# Patient Record
Sex: Female | Born: 1990 | Race: White | Hispanic: No | Marital: Single | Smoking: Never smoker
Health system: Southern US, Community
[De-identification: ages and names within clinical notes are randomized; demographics above are authoritative.]

## PROBLEM LIST (undated history)

## (undated) HISTORY — PX: WISDOM TOOTH EXTRACTION: SHX21

---

## 2017-08-13 ENCOUNTER — Other Ambulatory Visit: Payer: Self-pay | Admitting: Family Medicine

## 2017-08-13 ENCOUNTER — Ambulatory Visit
Admission: RE | Admit: 2017-08-13 | Discharge: 2017-08-13 | Disposition: A | Discharge status: Home / Self Care | Payer: 59 | Source: Ambulatory Visit | Attending: Family Medicine | Admitting: Family Medicine

## 2017-08-13 DIAGNOSIS — R0789 Other chest pain: Secondary | ICD-10-CM

## 2018-01-19 ENCOUNTER — Other Ambulatory Visit: Payer: Self-pay | Admitting: Family Medicine

## 2018-01-19 ENCOUNTER — Ambulatory Visit
Admission: RE | Admit: 2018-01-19 | Discharge: 2018-01-19 | Disposition: A | Discharge status: Home / Self Care | Payer: 59 | Source: Ambulatory Visit | Attending: Family Medicine | Admitting: Family Medicine

## 2018-01-19 DIAGNOSIS — W19XXXA Unspecified fall, initial encounter: Secondary | ICD-10-CM

## 2019-01-14 ENCOUNTER — Ambulatory Visit (HOSPITAL_COMMUNITY)
Admission: EM | Admit: 2019-01-14 | Discharge: 2019-01-14 | Disposition: A | Discharge status: Home / Self Care | Payer: 59

## 2019-01-14 ENCOUNTER — Other Ambulatory Visit: Payer: Self-pay

## 2019-01-14 ENCOUNTER — Encounter (HOSPITAL_COMMUNITY): Payer: Self-pay | Admitting: Emergency Medicine

## 2019-01-14 DIAGNOSIS — K122 Cellulitis and abscess of mouth: Secondary | ICD-10-CM

## 2019-01-14 DIAGNOSIS — J029 Acute pharyngitis, unspecified: Secondary | ICD-10-CM | POA: Diagnosis not present

## 2019-01-14 LAB — POCT INFECTIOUS MONO SCREEN: Mono Screen: NEGATIVE

## 2019-01-14 MED ORDER — AMOXICILLIN-POT CLAVULANATE 875-125 MG PO TABS: 1.0000 | ORAL_TABLET | Freq: Two times a day (BID) | ORAL | 0 refills | Status: AC

## 2019-01-14 MED ORDER — PREDNISONE 20 MG PO TABS: 20.0000 mg | ORAL_TABLET | Freq: Every day | ORAL | 0 refills | Status: AC

## 2019-01-14 NOTE — Discharge Instructions (Addendum)
Your mono test is negative, but sometimes it takes longer than one week for the test to turn positive, so if your symptoms persist with fatigue and swollen glands, make sure you follow up with your family Dr. If you dont have one you may establish with me.   I am placing you on 5 days of prednisone to help with the inflammation and swelling of your uvula, and an antibiotic to help prevent peritonsillar abscess since there is a little swelling above your L tonsil which may start this way with the swelling above the tonsil area.   You may use saline rinses like Netie pot to help clear the post nasal drainage twice a day for 5-7 days.

## 2019-01-14 NOTE — ED Provider Notes (Signed)
[facility]    CSN: [id number] Arrival date & time: 01/14/19  1024     History   Chief Complaint Chief Complaint  Patient presents with  . Sore Throat    HPI Olivia Cox is a 28 y.o. female.   Onset of ST x 5 days and noticed her uvula looked swollen since yesterday and noticed it when she was gagging. Had neg strep 2 days ago. Has been having a lot of post nasal dranaige. Denies itchy throat. And her glands got swollen and tender x 3 days. Has been tired. Missed work the last 2 days. Had HA yesterday. ST is worse on L than R today.      History reviewed. No pertinent past medical history.  There are no active problems to display for this patient.   Past Surgical History:  Procedure Laterality Date  . WISDOM TOOTH EXTRACTION      OB History   No obstetric history on file.      Home Medications    Prior to Admission medications   Medication Sig Start Date End Date Taking? Authorizing Provider  ibuprofen (ADVIL,MOTRIN) 400 MG tablet Take 400 mg by mouth every 6 (six) hours as needed.   Yes [provider]  norethindrone-ethinyl estradiol-iron (MICROGESTIN FE,GILDESS FE,LOESTRIN FE) 1.5-30 MG-MCG tablet Take 1 tablet by mouth daily.   Yes [provider]  amoxicillin-clavulanate (AUGMENTIN) 875-125 MG tablet Take 1 tablet by mouth every 12 (twelve) hours for 10 days. 01/14/19 01/24/19  Rodriguez-Southworth, Nettie Elm, PA-C  predniSONE (DELTASONE) 20 MG tablet Take 1 tablet (20 mg total) by mouth daily. 01/14/19   Rodriguez-Southworth, Nettie Elm, PA-C    Family History History reviewed. No pertinent family history.  Social History Social History   Tobacco Use  . Smoking status: Never Smoker  . Smokeless tobacco: Never Used  Substance Use Topics  . Alcohol use: Yes    Frequency: Never    Comment: Occassional  . Drug use: Never     Allergies   Patient has no known allergies.   Review of Systems Review of Systems    Constitutional: Positive for chills and fatigue. Negative for diaphoresis and fever.  HENT: Positive for postnasal drip, rhinorrhea, sore throat and trouble swallowing. Negative for dental problem, ear discharge, ear pain and voice change.   Eyes: Negative for discharge.  Respiratory: Negative for cough, chest tightness and shortness of breath.   Cardiovascular: Negative for chest pain.  Gastrointestinal: Negative for abdominal pain.  Musculoskeletal: Negative for myalgias.  Skin: Negative for rash.  Neurological: Positive for headaches. Negative for dizziness.  Hematological: Negative for adenopathy.   Physical Exam Triage Vital Signs ED Triage Vitals  Enc Vitals Group     BP 01/14/19 1118 122/71     Pulse Rate 01/14/19 1118 75     Resp 01/14/19 1118 16     Temp 01/14/19 1118 98.6 F (37 C)     Temp Source 01/14/19 1118 Oral     SpO2 01/14/19 1118 100 %     Weight --      Height --      Head Circumference --      Peak Flow --      Pain Score 01/14/19 1116 4     Pain Loc --      Pain Edu? --      Excl. in GC? --    No data found.  Updated Vital Signs BP 122/71 (BP Location: Left Arm)   Pulse 75  Temp 98.6 F (37 C) (Oral)   Resp 16   LMP 01/14/2019 (Exact Date)   SpO2 100%   Visual Acuity Right Eye Distance:   Left Eye Distance:   Bilateral Distance:    Right Eye Near:   Left Eye Near:    Bilateral Near:     Physical Exam Vitals signs and nursing note reviewed.  Constitutional:      General: She is not in acute distress.    Appearance: She is not toxic-appearing.  HENT:     Right Ear: Tympanic membrane normal. No drainage, swelling or tenderness. Tympanic membrane is not erythematous.     Left Ear: Tympanic membrane normal. No swelling or tenderness. Tympanic membrane is not erythematous.     Nose: Rhinorrhea present. No congestion.     Mouth/Throat:     Mouth: Mucous membranes are dry. No oral lesions.     Pharynx: Uvula midline. Posterior  oropharyngeal erythema and uvula swelling present. No oropharyngeal exudate.     Tonsils: No tonsillar exudate. Swelling: 2+ on the right. 2+ on the left.     Comments: Has mild swelling over the L tonsil, but uvula is not deviated Eyes:     Conjunctiva/sclera: Conjunctivae normal.  Neck:     Musculoskeletal: Neck supple.     Thyroid: No thyromegaly.     Comments: Has enlarged tender bilateral upper chain nodes, but none on posterior or supraclavicular region Cardiovascular:     Rate and Rhythm: Normal rate and regular rhythm.     Heart sounds: No murmur.  Pulmonary:     Effort: Pulmonary effort is normal.     Breath sounds: Normal breath sounds.  Abdominal:     General: Bowel sounds are normal.     Palpations: Abdomen is soft. There is no mass.     Tenderness: There is no abdominal tenderness. There is no guarding.  Lymphadenopathy:     Cervical: Cervical adenopathy present.  Skin:    General: Skin is warm and dry.     Findings: No rash.  Neurological:     General: No focal deficit present.     Mental Status: She is alert and oriented to person, place, and time.  Psychiatric:        Mood and Affect: Mood normal.        Behavior: Behavior normal.    UC Treatments / Results  Labs (all labs ordered are listed, but only abnormal results are displayed) Labs Reviewed  POCT INFECTIOUS MONO SCREEN  POCT INFECTIOUS MONO SCREEN    EKG None  Radiology No results found.  Procedures Procedures   Medications Ordered in UC Medications - No data to display  Initial Impression / Assessment and Plan / UC Course  I have reviewed the triage vital signs and the nursing notes. Pertinent labs  results that were available during my care of the patient were reviewed by me and considered in my medical decision making (see chart for details). She seems to have inflammation  And infection of her uvula and I am concerned of early L peritonsillar abscess. I placed her on Augmentin and  Prednisone as noted. If she gets worse needs to go to ER. See instructions.     Final Clinical Impressions(s) / UC Diagnoses   Final diagnoses:  Uvulitis     Discharge Instructions     Your mono test is negative, but sometimes it takes longer than one week for the test to turn positive, so if your symptoms  persist with fatigue and swollen glands, make sure you follow up with your family Dr. If you dont have one you may establish with me.   I am placing you on 5 days of prednisone to help with the inflammation and swelling of your uvula, and an antibiotic to help prevent peritonsillar abscess since there is a little swelling above your L tonsil which may start this way with the swelling above the tonsil area.   You may use saline rinses like Netie pot to help clear the post nasal drainage twice a day for 5-7 days.      ED Prescriptions    Medication Sig Dispense Auth. Provider   predniSONE (DELTASONE) 20 MG tablet Take 1 tablet (20 mg total) by mouth daily. 5 tablet Rodriguez-Southworth, Nettie ElmSylvia, PA-C   amoxicillin-clavulanate (AUGMENTIN) 875-125 MG tablet Take 1 tablet by mouth every 12 (twelve) hours for 10 days. 20 tablet Rodriguez-Southworth, Nettie ElmSylvia, PA-C     Controlled Substance Prescriptions [place] Controlled Substance Registry consulted?    Garey HamRodriguez-Southworth, Ary Rudnick, [place] 01/15/19 1938

## 2019-01-14 NOTE — ED Triage Notes (Signed)
The patient presented to the [facility] with a complaint of a sore throat x 5 days. The patient denied any fever.

## 2019-09-20 ENCOUNTER — Encounter (HOSPITAL_COMMUNITY): Payer: Self-pay

## 2019-09-20 ENCOUNTER — Ambulatory Visit (INDEPENDENT_AMBULATORY_CARE_PROVIDER_SITE_OTHER): Payer: BC Managed Care – PPO

## 2019-09-20 ENCOUNTER — Ambulatory Visit (HOSPITAL_COMMUNITY)
Admission: EM | Admit: 2019-09-20 | Discharge: 2019-09-20 | Disposition: A | Discharge status: Home / Self Care | Payer: BC Managed Care – PPO | Attending: Family Medicine | Admitting: Family Medicine

## 2019-09-20 DIAGNOSIS — M545 Low back pain, unspecified: Secondary | ICD-10-CM

## 2019-09-20 MED ORDER — MELOXICAM 15 MG PO TABS: 15.0000 mg | ORAL_TABLET | Freq: Every day | ORAL | 0 refills | Status: AC

## 2019-09-20 MED ORDER — CYCLOBENZAPRINE HCL 5 MG PO TABS: 5.0000 mg | ORAL_TABLET | Freq: Three times a day (TID) | ORAL | 0 refills | Status: AC | PRN

## 2019-09-20 NOTE — Discharge Instructions (Addendum)
Xray without acute findings here today.  Meloxicam daily, take with food. Don't take additional antifibrillatories.  Flexeril as needed as a muscle relaxer, primarily before bed, may use every 8 hours as needed, however. May cause drowsiness. Please do not take if driving or drinking alcohol.   See exercises provided for strengthening to help in prevention.   Follow up with your PCP and/or sports medicine for recheck and management if persistent.

## 2019-09-20 NOTE — ED Triage Notes (Signed)
Patient report having back pain x 4 days, she feels he back in popping all the time.

## 2019-09-20 NOTE — ED Provider Notes (Signed)
[facility]    CSN: [id number] Arrival date & time: 09/20/19  1011      History   Chief Complaint Chief Complaint  Patient presents with  . Back Pain    HPI Olivia Cox is a 28 y.o. female.   Olivia Cox presents with complaints of low back pain. She has had multiple episodes of back pain, she thinks approximately 3 times over the past 1-2 years. States she has "thrown it out." No specific injury, three days ago after showering she sat on her bed and then stood up and felt pain, has had pain ever since. Sharp and stabbing pain. Certain movements do worsen it. Laying flat worsens it. No radiation of pain. No weakness, numbness or tingling. No saddle symptoms. No loss of bladder or bowel. 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 No specific recent heavy lifting prior to onset of symptoms. Pain greater to right side but is to entire low back. Pain 7/10 in severity. Took ibuprofen yesterday morning, no medications today. States she has been seen by her PCP as well as chiropractor for this in the past. States she has an appointment next week to establish with a new PCP.     ROS per HPI, negative if not otherwise mentioned.      History reviewed. No pertinent past medical history.  There are no active problems to display for this patient.   Past Surgical History:  Procedure Laterality Date  . WISDOM TOOTH EXTRACTION      OB History   No obstetric history on file.      Home Medications    Prior to Admission medications   Medication Sig Start Date End Date Taking? Authorizing Provider  cyclobenzaprine (FLEXERIL) 5 MG tablet Take 1 tablet (5 mg total) by mouth 3 (three) times daily as needed for muscle spasms. 09/20/19   Georgetta HaberBurky, Shia Eber B, NP  ibuprofen (ADVIL,MOTRIN) 400 MG tablet Take 400 mg by mouth every 6 (six) hours as needed.    [provider]   meloxicam (MOBIC) 15 MG tablet Take 1 tablet (15 mg total) by mouth daily. 09/20/19   Georgetta HaberBurky, Suleman Gunning B, NP  norethindrone-ethinyl estradiol-iron (MICROGESTIN FE,GILDESS FE,LOESTRIN FE) 1.5-30 MG-MCG tablet Take 1 tablet by mouth daily.    [provider]  predniSONE (DELTASONE) 20 MG tablet Take 1 tablet (20 mg total) by mouth daily. 01/14/19   Rodriguez-Southworth, Nettie ElmSylvia, PA-C    Family History History reviewed. No pertinent family history.  Social History Social History   Tobacco Use  . Smoking status: Never Smoker  . Smokeless tobacco: Never Used  Substance Use Topics  . Alcohol use: Yes    Frequency: Never    Comment: Occassional  . Drug use: Never     Allergies   Patient has no known allergies.   Review of Systems Review of Systems   Physical Exam Triage Vital Signs ED Triage Vitals  Enc Vitals Group     BP 09/20/19 1055 124/62     Pulse Rate 09/20/19 1055 85     Resp 09/20/19 1055 15     Temp 09/20/19 1055 98.5 F (36.9 C)     Temp Source 09/20/19 1055 Oral     SpO2 09/20/19 1055 99 %     Weight --      Height --      Head Circumference --  Peak Flow --      Pain Score 09/20/19 1053 8     Pain Loc --      Pain Edu? --      Excl. in GC? --    No data found.  Updated Vital Signs BP 124/62 (BP Location: Right Arm)   Pulse 85   Temp 98.5 F (36.9 C) (Oral)   Resp 15   LMP 09/20/2019 (Exact Date)   SpO2 99%   Visual Acuity Right Eye Distance:   Left Eye Distance:   Bilateral Distance:    Right Eye Near:   Left Eye Near:    Bilateral Near:     Physical Exam Constitutional:      General: She is not in acute distress.    Appearance: She is well-developed.  Cardiovascular:     Rate and Rhythm: Normal rate and regular rhythm.     Heart sounds: Normal heart sounds.  Pulmonary:     Effort: Pulmonary effort is normal.     Breath sounds: Normal breath sounds.  Musculoskeletal:     Lumbar back: She exhibits pain. She exhibits normal  range of motion, no tenderness, no bony tenderness, no swelling, no edema, no deformity, no laceration, no spasm and normal pulse.       Back:     Comments: Pain to low back, unable to reproduce with palpation; pain with transition from sit to lay and lay to sit; no pain with hip flexion or straight leg raise; no step off or deformity to spinous processes, no pain to spinous processes; strength equal bilaterally; gross sensation intact to lower extremities; pain with back extension noted such as with ambulation  Skin:    General: Skin is warm and dry.  Neurological:     Mental Status: She is alert and oriented to person, place, and time.      UC Treatments / Results  Labs (all labs ordered are listed, but only abnormal results are displayed) Labs Reviewed - No data to display  EKG   Radiology Dg Lumbar Spine Complete  Result Date: 09/20/2019 CLINICAL DATA:  Back pain EXAM: LUMBAR SPINE - COMPLETE 4+ VIEW COMPARISON:  None. FINDINGS: There appear to be rudimentary ribs at the T12 level. There is no displaced fracture. The disc heights are relatively well preserved. There is mild disc height loss at the L5-S1 level IMPRESSION: Normal study. Electronically Signed   By: Katherine Mantle M.D.   On: 09/20/2019 11:41    Procedures Procedures (including critical care time)  Medications Ordered in UC Medications - No data to display  Initial Impression / Assessment and Plan / UC Course  I have reviewed the triage vital signs and the nursing notes.  Pertinent labs & imaging results that were available during my care of the patient were reviewed by me and considered in my medical decision making (see chart for details).     Intermittent pain for years. No new injury prior to onset today. Films without acute findings. Muscle strain/ spasm likely at this point. Nsaids, muscle relaxer's recommended. Follow up with pcp and/or sports medicine. Patient verbalized understanding and agreeable  to plan.  Ambulatory out of clinic without difficulty.   Final Clinical Impressions(s) / UC Diagnoses   Final diagnoses:  Acute bilateral low back pain without sciatica     Discharge Instructions     Xray without acute findings here today.  Meloxicam daily, take with food. Don't take additional antifibrillatories.  Flexeril as needed as a  muscle relaxer, primarily before bed, may use every 8 hours as needed, however. May cause drowsiness. Please do not take if driving or drinking alcohol.   See exercises provided for strengthening to help in prevention.   Follow up with your PCP and/or sports medicine for recheck and management if persistent.    ED Prescriptions    Medication Sig Dispense Auth. Provider   meloxicam (MOBIC) 15 MG tablet Take 1 tablet (15 mg total) by mouth daily. 20 tablet Augusto Gamble B, NP   cyclobenzaprine (FLEXERIL) 5 MG tablet Take 1 tablet (5 mg total) by mouth 3 (three) times daily as needed for muscle spasms. 15 tablet Zigmund Gottron, NP     PDMP not reviewed this encounter.   Zigmund Gottron, NP 09/20/19 1216

## 2019-09-28 ENCOUNTER — Other Ambulatory Visit: Payer: Self-pay

## 2019-09-28 ENCOUNTER — Ambulatory Visit: Payer: BC Managed Care – PPO | Attending: Neurosurgery | Admitting: Physical Therapy

## 2019-09-28 ENCOUNTER — Encounter: Payer: Self-pay | Admitting: Physical Therapy

## 2019-09-28 DIAGNOSIS — M545 Low back pain, unspecified: Secondary | ICD-10-CM

## 2019-09-28 DIAGNOSIS — G8929 Other chronic pain: Secondary | ICD-10-CM | POA: Insufficient documentation

## 2019-09-28 DIAGNOSIS — M6281 Muscle weakness (generalized): Secondary | ICD-10-CM | POA: Insufficient documentation

## 2019-09-28 NOTE — Therapy (Signed)
[facility] [address], [address] [place], [place], [address] Phone: [phone]   Fax:  [phone]  Physical Therapy Evaluation  Patient Details  Name: Olivia Cox MRN: [id number] Date of Birth: June 19, 1991 Referring Provider (PT): Dr. Newell CoralNudelman    Encounter Date: 09/28/2019  PT End of Session - 09/28/19 1720    Visit Number  1    Date for PT Re-Evaluation  11/23/19    Authorization Type  BCBS 60 visit limit    PT Start Time  1102    PT Stop Time  1145    PT Time Calculation (min)  43 min    Activity Tolerance  Patient tolerated treatment well       History reviewed. No pertinent past medical history.  Past Surgical History:  Procedure Laterality Date  . WISDOM TOOTH EXTRACTION      There were no vitals filed for this visit.   Subjective Assessment - 09/28/19 1104    Subjective  2 years back pain for no apparent reason.  Last Sunday, got out of bed and was in pain.  Dr. thinks it's my mild scoliosis. Sharp stabbing pain both sides near tailbone.  Sore right > left.  My back feels weak. I do the Elliptical and light weights    Pertinent History  scoliosis    How long can you sit comfortably?  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  standing to do dishes hurts    How long can you walk comfortably?  the more I move around the worse;  comfortably 30 minutes    Diagnostic tests  x-rays    Patient Stated Goals  work on posture; be able to function normally    Currently in Pain?  Yes    Pain Score  4     Pain Location  Back    Pain Orientation  Right;Left    Pain Type  Chronic pain    Pain Radiating Towards  radiating left anterior thigh and right groin    Pain Onset  More than a month ago    Pain Frequency  Constant    Aggravating Factors   sitting; twisting; lifting; I can't vacumn;  a lot of stairs if I did  them at the office    Pain Relieving Factors  lying down         [facility] - 09/28/19 0001      Assessment   Medical Diagnosis  low back pain without sciatica     Referring Provider (PT)  Dr. Newell CoralNudelman     Onset Date/Surgical Date  --   2 years but last Sunday was the pinnacle    Next MD Visit  6 weeks Nov 23    Prior Therapy  no      Precautions   Precautions  None      Restrictions   Weight Bearing Restrictions  No      Balance Screen   Has the patient fallen in the past 6 months  No    Has the patient had a decrease in activity level because of a fear of falling?   No    Is the patient reluctant to leave their home because of a fear of falling?   No      Home Public house managernvironment   Living Environment  Private residence  Living Arrangements  Alone    Type of Trainer to enter      Prior Function   Vocation  Full time employment    Vocation Requirements  mostly sitting, some lifting     Leisure  video gamer; walk dog but avoiding lately       Observation/Other Assessments   Focus on Therapeutic Outcomes (FOTO)   47% limitation       Posture/Postural Control   Posture/Postural Control  Postural limitations    Posture Comments  right scoliosis with left iliac crest higher initially however no pelvic obliquity following session       AROM   Overall AROM Comments  No clear directional preference with repeated flexion in lying or extension in lying     Lumbar Flexion  60    Lumbar Extension  15    Lumbar - Right Side Bend  45    Lumbar - Left Side Bend  40      Strength   Overall Strength Comments  difficulty activating transverse abdominus and lumbar multifidi     Right Hip Extension  4-/5    Right Hip ABduction  4-/5    Left Hip Extension  4/5    Left Hip ABduction  4/5    Lumbar Flexion  4-/5    Lumbar Extension  4-/5      Flexibility   Soft Tissue Assessment /Muscle Length  yes    Hamstrings  85 degrees bil     Quadriceps   slight reduction in right hip flexor length       Slump test   Findings  Negative      Straight Leg Raise   Findings  Negative                Objective measurements completed on examination: See above findings.              PT Education - 09/28/19 1719    Education Details  Access Code: [email or web address]  prone press ups, supine, seated and standing transverse abdom activation;  bird dogs, lumbar support with sitting    Person(s) Educated  Patient    Methods  Explanation;Demonstration;Handout    Comprehension  Returned demonstration;Verbalized understanding       PT Short Term Goals - 09/28/19 1737      PT SHORT TERM GOAL #1   Title  The patient will demonstrate understanding of basic self care, postural support when sitting and initial HEP    Time  4    Period  Weeks    Status  New    Target Date  10/26/19      PT SHORT TERM GOAL #2   Title  The patient will report a 30% reduction in bilateral low back pain as well as radiating left thigh and right groin pain with home and work ADLs    Time  4    Period  Weeks    Status  New      PT SHORT TERM GOAL #3   Title  The patient will be able to stand for 15 minutes to wash dishes, cook meals    Time  4    Period  Weeks    Status  New      PT SHORT TERM GOAL #4   Title  The patient will be able to walk her dog 30 minutes with pain level < 5/10    Time  4    Period  Weeks    Status  New        PT Long Term Goals - 09/28/19 1740      PT LONG TERM GOAL #1   Title  The patient will be independent with safe self progression of HEP for further improvements in pain/function    Time  8    Period  Weeks    Status  New    Target Date  11/23/19      PT LONG TERM GOAL #2   Title  The patient will report a 60% improvement in bilateral back pain, left thigh pain and right groin pain with sitting, standing, walking    Time  8    Period  Weeks    Status  New      PT LONG TERM GOAL #3   Title  The patient will  have improved abdominal, trunk extensor, hip extensor and abductor strength to grossly 4/5 to 4+/5 needed for climbing flights of stairs and lifting medium objects for work.    Time  8    Period  Weeks    Status  New      PT LONG TERM GOAL #4   Title  The patient will demonstrate understanding of best body mechanics for lifting and vacumning    Time  8    Period  Weeks    Status  New      PT LONG TERM GOAL #5   Title  FOTO functional outcome score improved from 47% limitation to 31%    Time  8    Period  Weeks    Status  New             Plan - 09/28/19 1721    Clinical Impression Statement  The patient is a pleasant 28 year old with a history of scoliosis and LBP for the last 2 years.  She reports that last Sunday she awoke with increased back pain bilaterally with referred pain to left anterior thigh and right groin.  She was hardly able to get out of bed.  She reports her pain is worsened with sitting too long, twisting, lifting, standing to do dishes, walking > 30 minutes and she is unable to vacumn.  She is better with lying down.  She is working from home right now sitting the majority of the day but also does some lifting for work.  Her lumbar ROM is slightly limited:  flexion 60, extension 15, right sidebending 45, left sidebending 40 degrees.  Right scoliosis with left pelvis initially higher than right although following session notably less asymmetry with patient reporting, "I can stand straighter now."  No worse or better with repeated flexion or extension in lying.  Decreased lumbo/pelvic/hip strength noted especially on right.  She has a very positive response to initiation of stabilization exercise.   She is highly motivated to finally get treatment for this problem after 2 years.  She would benefit from PT to address these deficits.    Examination-Activity Limitations  Lift;Locomotion Level;Stairs;Stand;Sit    Examination-Participation Restrictions  Community Activity;Other     Stability/Clinical Decision Making  Stable/Uncomplicated    Clinical Decision Making  Low    Rehab Potential  Good    PT Frequency  2x / week    PT Duration  8 weeks    PT Treatment/Interventions  ADLs/Self Care Home Management;Electrical Stimulation;Cryotherapy;Moist Heat;Ultrasound;Traction;Neuromuscular re-education;Therapeutic exercise;Therapeutic activities;Patient/family education;Manual techniques;Taping;Dry needling    PT Next Visit Plan  assess response to initial HEP;  progress lumbo/pelvic/hip strengthening/stabilization in progressively challenging positions; postural strengthening    PT Home Exercise Plan  Access Code: [email or web address]    Consulted and Agree with Plan of Care  Patient       Patient will benefit from skilled therapeutic intervention in order to improve the following deficits and impairments:  Pain, Decreased activity tolerance, Decreased strength, Postural dysfunction  Visit Diagnosis: Chronic bilateral low back pain without sciatica - Plan: PT plan of care cert/re-cert  Muscle weakness (generalized) - Plan: PT plan of care cert/re-cert     Problem List There are no active problems to display for this patient.  [name], PT 09/28/19 5:52 PM Phone: [phone] Fax: [phone] [name] 09/28/2019, 5:51 PM  [facility] [address] [place], [place], [address] Phone: [phone]   Fax:  [phone]  Name: Olivia Cox MRN: [id number] Date of Birth: 23-Feb-1991

## 2019-09-28 NOTE — Patient Instructions (Signed)
Access Code: [email or web address]  URL: [email or web address]  Date: 09/28/2019  Prepared by: Ruben Im   Exercises  Prone Press Up - 10 reps - 1 sets - 2-3x daily - 7x weekly  Hooklying Transversus Abdominis Palpation - 10 reps - 1 sets - 10x daily - 7x weekly  Seated Transversus Abdominis Bracing - 10 reps - 3 sets - 1x daily - 7x weekly  Standing Transverse Abdominis Contraction - 10 reps - 1 sets - 1x daily - 7x weekly  Bird Dog - 10 reps - 1 sets - 1x daily - 7x weekly

## 2019-10-04 ENCOUNTER — Other Ambulatory Visit: Payer: Self-pay

## 2019-10-04 ENCOUNTER — Encounter: Payer: Self-pay | Admitting: Physical Therapy

## 2019-10-04 ENCOUNTER — Ambulatory Visit: Payer: BC Managed Care – PPO | Admitting: Physical Therapy

## 2019-10-04 DIAGNOSIS — G8929 Other chronic pain: Secondary | ICD-10-CM

## 2019-10-04 DIAGNOSIS — M545 Low back pain: Secondary | ICD-10-CM | POA: Diagnosis not present

## 2019-10-04 DIAGNOSIS — M6281 Muscle weakness (generalized): Secondary | ICD-10-CM

## 2019-10-04 NOTE — Therapy (Signed)
[facility] [address], [place], [place], [address] Phone: [phone]   Fax:  [phone]  Physical Therapy Treatment  Patient Details  Name: Olivia Cox MRN: [id number] Date of Birth: 02-Aug-1991 Referring Provider (PT): Dr. Sherwood Gambler    Encounter Date: 10/04/2019  PT End of Session - 10/04/19 1618    Visit Number  2    Date for PT Re-Evaluation  11/23/19    Authorization Type  BCBS 60 visit limit    PT Start Time  1618    PT Stop Time  1700    PT Time Calculation (min)  42 min    Activity Tolerance  Patient tolerated treatment well    Behavior During Therapy  [facility] for tasks assessed/performed       History reviewed. No pertinent past medical history.  Past Surgical History:  Procedure Laterality Date  . WISDOM TOOTH EXTRACTION      There were no vitals filed for this visit.  Subjective Assessment - 10/04/19 1619    Subjective  Back is feeling a little better: yesterday worse ( at office) good day today ( at home.) I have sharp pinching today.    Pertinent History  scoliosis    Currently in Pain?  --   Currently 0/10, but has had pinching on & off today.                      [facility] Adult PT Treatment/Exercise - 10/04/19 0001      Lumbar Exercises: Stretches   Press Ups  10 reps   VC/TC on [employer] Ups Limitations  Pt encouraged to not extend  so much as she was creating very sharp angle at lumbar spine, try to lengthen more with la more moderate extension.       Lumbar Exercises: Supine   Ab Set  10 reps;2 seconds    AB Set Limitations  VC to find the contraction    Bridge  --   2x5 with VC to contract LT glute more   Bridge Limitations  Added to HEP      Lumbar Exercises: Quadruped   Opposite Arm/Leg Raise  --   2x5 VC/Tc for technique   Opposite Arm/Leg Raise Limitations  pt tends to hike pelvis on LT and lean RT      Manual Therapy   Muscle Energy Technique   To correct Lt anterior rotated pelvis             PT Education - 10/04/19 1651    Education Details  Supine bridge added to HEP    Person(s) Educated  Patient    Methods  Explanation;Demonstration;Tactile cues;Verbal cues;Handout    Comprehension  Returned demonstration;Verbalized understanding       PT Short Term Goals - 09/28/19 1737      PT SHORT TERM GOAL #1   Title  The patient will demonstrate understanding of basic self care, postural support when sitting and initial HEP    Time  4    Period  Weeks    Status  New    Target Date  10/26/19      PT SHORT TERM GOAL #2   Title  The patient will report a 30% reduction in bilateral low back pain as well as radiating left thigh and right groin pain with home and work ADLs    Time  4    Period  Weeks    Status  New      PT SHORT TERM GOAL #3   Title  The patient will be able to stand for 15 minutes to wash dishes, cook meals    Time  4    Period  Weeks    Status  New      PT SHORT TERM GOAL #4   Title  The patient will be able to walk her dog 30 minutes with pain level < 5/10    Time  4    Period  Weeks    Status  New        PT Long Term Goals - 09/28/19 1740      PT LONG TERM GOAL #1   Title  The patient will be independent with safe self progression of HEP for further improvements in pain/function    Time  8    Period  Weeks    Status  New    Target Date  11/23/19      PT LONG TERM GOAL #2   Title  The patient will report a 60% improvement in bilateral back pain, left thigh pain and right groin pain with sitting, standing, walking    Time  8    Period  Weeks    Status  New      PT LONG TERM GOAL #3   Title  The patient will have improved abdominal, trunk extensor, hip extensor and abductor strength to grossly 4/5 to 4+/5 needed for climbing flights of stairs and lifting medium objects for work.    Time  8    Period  Weeks    Status  New      PT LONG TERM GOAL #4   Title  The patient will  demonstrate understanding of best body mechanics for lifting and vacumning    Time  8    Period  Weeks    Status  New      PT LONG TERM GOAL #5   Title  FOTO functional outcome score improved from 47% limitation to 31%    Time  8    Period  Weeks    Status  New            Plan - 10/04/19 1625    Clinical Impression Statement  Pt arrives today with no current pain but did have "pinching" in her LT SI area on & off today. Her left ASIS was significantly lower than the RT ASIS. PTA performed MET to correct. It corrected almost to level pelvis in 2 bouts but remained slightly below the RT. Pt demonstrates weakness in her Lt buttocks. She required tactile cuing with her HEP including: less of an acute angle in her lumbar spine with her prone press up, lowering her LTLE to avoid hip hiking with bird dog, and exhaling a bit more helped her find her TA better. Added supine bridge to HEP to help facilitate her Lt gluteal strength.    Examination-Activity Limitations  Lift;Locomotion Level;Stairs;Stand;Sit    Examination-Participation Restrictions  Community Activity;Other    Stability/Clinical Decision Making  Stable/Uncomplicated    Rehab Potential  Good    PT Frequency  2x / week    PT Duration  8 weeks    PT Treatment/Interventions  ADLs/Self Care Home Management;Electrical Stimulation;Cryotherapy;Moist Heat;Ultrasound;Traction;Neuromuscular re-education;Therapeutic exercise;Therapeutic activities;Patient/family education;Manual techniques;Taping;Dry needling    PT Next Visit Plan  Check pelvis, correct if needed. Review technique with bird dog and bridge, continue to build core awareness and strength.    PT  Home Exercise Plan  Access Code: [email or web address]    Consulted and Agree with Plan of Care  Patient       Patient will benefit from skilled therapeutic intervention in order to improve the following deficits and impairments:  Pain, Decreased activity tolerance, Decreased strength, Postural  dysfunction  Visit Diagnosis: Chronic bilateral low back pain without sciatica  Muscle weakness (generalized)     Problem List There are no active problems to display for this patient.   Gricelda Foland, PTA 10/04/2019, 5:14 PM  [facility] [address] [place], [place], [address] Phone: [phone]   Fax:  [phone]  Name: Olivia Cox MRN: [id number] Date of Birth: Aug 11, 1991

## 2019-10-06 ENCOUNTER — Ambulatory Visit: Payer: BC Managed Care – PPO | Admitting: Physical Therapy

## 2019-10-06 ENCOUNTER — Other Ambulatory Visit: Payer: Self-pay

## 2019-10-06 DIAGNOSIS — M545 Low back pain: Secondary | ICD-10-CM | POA: Diagnosis not present

## 2019-10-06 DIAGNOSIS — M6281 Muscle weakness (generalized): Secondary | ICD-10-CM

## 2019-10-06 DIAGNOSIS — G8929 Other chronic pain: Secondary | ICD-10-CM

## 2019-10-06 NOTE — Therapy (Signed)
[facility] [address], [place], [place], [address] Phone: [phone]   Fax:  [phone]  Physical Therapy Treatment  Patient Details  Name: Olivia Cox MRN: [id number] Date of Birth: 1991-03-01 Referring Provider (PT): Dr. Sherwood Gambler    Encounter Date: 10/06/2019  PT End of Session - 10/06/19 1302    Visit Number  3    Date for PT Re-Evaluation  11/23/19    Authorization Type  BCBS 60 visit limit    PT Start Time  0845    PT Stop Time  0931    PT Time Calculation (min)  46 min    Activity Tolerance  Patient limited by pain       No past medical history on file.  Past Surgical History:  Procedure Laterality Date  . WISDOM TOOTH EXTRACTION      There were no vitals filed for this visit.  Subjective Assessment - 10/06/19 0844    Subjective  Wednesday was a good day but yesterday was bad.   My left hip sticks out to the side.  When it hurts really bad, I feel it in my thigh.    How long can you sit comfortably?  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  Yes    Pain Score  6     Pain Location  Back    Pain Orientation  Left    Pain Type  Chronic pain                       [facility] Adult PT Treatment/Exercise - 10/06/19 0001      Lumbar Exercises: Standing   Other Standing Lumbar Exercises  Wall sidegliding: right shoulder to folded pillow with gliding hips to the right 10x       Lumbar Exercises: Supine   Ab Set  10 reps;2 seconds    Clam  10 reps    Clam Limitations  green band almost isometrically     Other Supine Lumbar Exercises  ball squeeze with abdom brace 10x 5 sec holds       Lumbar Exercises: Sidelying   Other Sidelying Lumbar Exercises  left sidelying over folded pillow 3 minutes       Lumbar Exercises: Prone   Other Prone Lumbar Exercises  partial press up with hips offset right 7x     Other Prone Lumbar Exercises  roadkill position with partial press up 7x       Lumbar Exercises: Quadruped   Opposite Arm/Leg Raise  Right arm/Left leg;Left arm/Right leg;5 reps      Manual Therapy   Joint Mobilization  manual shift correction 10x     Kinesiotex  Facilitate Muscle      Kinesiotix   Facilitate Muscle   lumbar region star pattern              PT Education - 10/06/19 1301    Education Details  lateral shift correction; home TENS info    Person(s) Educated  Patient    Methods  Explanation;Demonstration;Handout    Comprehension  Returned demonstration;Verbalized understanding       PT Short Term Goals - 09/28/19 1737      PT SHORT TERM GOAL #1   Title  The patient will demonstrate understanding of basic self care, postural support when sitting and initial HEP  Time  4    Period  Weeks    Status  New    Target Date  10/26/19      PT SHORT TERM GOAL #2   Title  The patient will report a 30% reduction in bilateral low back pain as well as radiating left thigh and right groin pain with home and work ADLs    Time  4    Period  Weeks    Status  New      PT SHORT TERM GOAL #3   Title  The patient will be able to stand for 15 minutes to wash dishes, cook meals    Time  4    Period  Weeks    Status  New      PT SHORT TERM GOAL #4   Title  The patient will be able to walk her dog 30 minutes with pain level < 5/10    Time  4    Period  Weeks    Status  New        PT Long Term Goals - 09/28/19 1740      PT LONG TERM GOAL #1   Title  The patient will be independent with safe self progression of HEP for further improvements in pain/function    Time  8    Period  Weeks    Status  New    Target Date  11/23/19      PT LONG TERM GOAL #2   Title  The patient will report a 60% improvement in bilateral back pain, left thigh pain and right groin pain with sitting, standing, walking    Time  8    Period  Weeks    Status  New      PT LONG TERM GOAL #3    Title  The patient will have improved abdominal, trunk extensor, hip extensor and abductor strength to grossly 4/5 to 4+/5 needed for climbing flights of stairs and lifting medium objects for work.    Time  8    Period  Weeks    Status  New      PT LONG TERM GOAL #4   Title  The patient will demonstrate understanding of best body mechanics for lifting and vacumning    Time  8    Period  Weeks    Status  New      PT LONG TERM GOAL #5   Title  FOTO functional outcome score improved from 47% limitation to 31%    Time  8    Period  Weeks    Status  New            Plan - 10/06/19 0916    Clinical Impression Statement  The patient presents with a right lateral shift and flexed forward posture.  After standing, sidelying and prone lateral compartment techniques the shift decreases but tends to return after a  few minutes.  Initiated trial of shift correction and discussion of trial for the weekend.  Improved transverse mucle activation however decreased lumbo/pelvic/hip stability especially on the left in quadruped.    Discussed home TENs for pain control.    Rehab Potential  Good    PT Frequency  2x / week    PT Duration  8 weeks    PT Treatment/Interventions  ADLs/Self Care Home Management;Electrical Stimulation;Cryotherapy;Moist Heat;Ultrasound;Traction;Neuromuscular re-education;Therapeutic exercise;Therapeutic activities;Patient/family education;Manual techniques;Taping;Dry needling    PT Next Visit Plan  Check response to shift correction;  ES for pain  control;  core strengthening;  KT if helpful or may try McConnell tape if more stability needed    PT Home Exercise Plan  Access Code: [email or web address]       Patient will benefit from skilled therapeutic intervention in order to improve the following deficits and impairments:  Pain, Decreased activity tolerance, Decreased strength, Postural dysfunction  Visit Diagnosis: Chronic bilateral low back pain without sciatica  Muscle weakness  (generalized)     Problem List There are no active problems to display for this patient.  Lavinia SharpsStacy Reveca Desmarais, PT 10/06/19 1:09 PM Phone: [phone] Fax: [phone] Vivien PrestoSimpson, Bearett Porcaro C 10/06/2019, 1:09 PM  [facility] [address], [address] [place], [place], [address] Phone: [phone]   Fax:  [phone]  Name: Olivia Cox MRN: [id number] Date of Birth: 06/04/1991

## 2019-10-06 NOTE — Patient Instructions (Signed)
Access Code: [email or web address]  URL: [email or web address]  Date: 10/06/2019  Prepared by: Ruben Im   Exercises  Prone Press Up - 10 reps - 1 sets - 2-3x daily - 7x weekly  Hooklying Transversus Abdominis Palpation - 10 reps - 1 sets - 10x daily - 7x weekly  Seated Transversus Abdominis Bracing - 10 reps - 3 sets - 1x daily - 7x weekly  Standing Transverse Abdominis Contraction - 10 reps - 1 sets - 1x daily - 7x weekly  Bird Dog - 10 reps - 1 sets - 1x daily - 7x weekly  Supine Bridge - 10 reps - 1 sets - 5 hold - 1x daily - 7x weekly  Right Standing Lateral Shift Correction at Wall - Repetitions - 10 reps - 1 sets - 1x daily - 7x weekly  Sidelying Transversus Abdominis Bracing - 10 reps - 3 sets - 53minutes hold - 1x daily - 7x weekly  Prone Press Up with Lateral Shifts - 10 reps - 1 sets - 1x daily - 7x weekly  TENS UNIT  This is helpful for muscle pain and spasm.   Search and Purchase a TENS 7000 2nd edition at www.tenspros.com or www.amazon.com  (It should be less than $30)     TENS unit instructions:   Do not shower or bathe with the unit on  Turn the unit off before removing electrodes or batteries  If the electrodes lose stickiness add a drop of water to the electrodes after they are disconnected from the unit and place on plastic sheet. If you continued to have difficulty, call the TENS unit company to purchase more electrodes.  Do not apply lotion on the skin area prior to use. Make sure the skin is clean and dry as this will help prolong the life of the electrodes.  After use, always check skin for unusual red areas, rash or other skin difficulties. If there are any skin problems, does not apply electrodes to the same area.  Never remove the electrodes from the unit by pulling the wires.  Do not use the TENS unit or electrodes other than as directed.  Do not change electrode placement without consulting your therapist or physician.  Keep 2 fingers with  between each electrode.

## 2019-10-10 ENCOUNTER — Encounter: Payer: Self-pay | Admitting: Physical Therapy

## 2019-10-10 ENCOUNTER — Other Ambulatory Visit: Payer: Self-pay

## 2019-10-10 ENCOUNTER — Ambulatory Visit: Payer: BC Managed Care – PPO | Admitting: Physical Therapy

## 2019-10-10 DIAGNOSIS — M6281 Muscle weakness (generalized): Secondary | ICD-10-CM

## 2019-10-10 DIAGNOSIS — G8929 Other chronic pain: Secondary | ICD-10-CM

## 2019-10-10 DIAGNOSIS — M545 Low back pain: Secondary | ICD-10-CM

## 2019-10-10 NOTE — Therapy (Signed)
[facility] [address], [address] [place], [place], [address] Phone: [phone]   Fax:  [phone]  Physical Therapy Treatment  Patient Details  Name: Olivia BlinksGabrielle M Charpentier MRN: [id number] Date of Birth: 08-06-1991 Referring Provider (PT): Dr. Newell CoralNudelman    Encounter Date: 10/10/2019  PT End of Session - 10/10/19 1939    Visit Number  4    Date for PT Re-Evaluation  11/23/19    Authorization Type  BCBS 60 visit limit    PT Start Time  0935    PT Stop Time  1014    PT Time Calculation (min)  39 min    Activity Tolerance  Patient tolerated treatment well       History reviewed. No pertinent past medical history.  Past Surgical History:  Procedure Laterality Date  . WISDOM TOOTH EXTRACTION      There were no vitals filed for this visit.  Subjective Assessment - 10/10/19 0936    Subjective  3 good days.  Some pinching with walking or sitting too long 15-30 minutes.  Before I couldn't sit for 10 minutes.  Still have KT on.    Currently in Pain?  Yes    Pain Score  3     Pain Location  Back    Pain Type  Chronic pain                       [facility] Adult PT Treatment/Exercise - 10/10/19 0001      Lumbar Exercises: Supine   Ab Set  10 reps;2 seconds    Large Ball Abdominal Isometric  10 reps    Large Ball Oblique Isometric  10 reps    Other Supine Lumbar Exercises  blue band whole leg press down 10x each side       Lumbar Exercises: Sidelying   Clam  Right;Left;10 reps      Lumbar Exercises: Prone   Straight Leg Raise  5 reps    Other Prone Lumbar Exercises  prone multifidi press over 2 pillows but bothered stomach/queasy       Lumbar Exercises: Quadruped   Opposite Arm/Leg Raise  Right arm/Left leg;Left arm/Right leg;5 reps   with foam roll along head/spine for feedback      Knee/Hip Exercises: Standing   Other Standing Knee Exercises  blue band around thighs with side stepping 10x    Other  Standing Knee Exercises  blue band monster walks 10x              PT Education - 10/10/19 1203    Education Details  [email or web address]  clams sidelying; standing band side stepping    Person(s) Educated  Patient    Methods  Explanation;Demonstration;Handout       PT Short Term Goals - 09/28/19 1737      PT SHORT TERM GOAL #1   Title  The patient will demonstrate understanding of basic self care, postural support when sitting and initial HEP    Time  4    Period  Weeks    Status  New    Target Date  10/26/19      PT SHORT TERM GOAL #2   Title  The patient will report a 30% reduction in bilateral low back pain as well as radiating left thigh and right groin pain with home and work ADLs    Time  4    Period  Weeks    Status  New  PT SHORT TERM GOAL #3   Title  The patient will be able to stand for 15 minutes to wash dishes, cook meals    Time  4    Period  Weeks    Status  New      PT SHORT TERM GOAL #4   Title  The patient will be able to walk her dog 30 minutes with pain level < 5/10    Time  4    Period  Weeks    Status  New        PT Long Term Goals - 09/28/19 1740      PT LONG TERM GOAL #1   Title  The patient will be independent with safe self progression of HEP for further improvements in pain/function    Time  8    Period  Weeks    Status  New    Target Date  11/23/19      PT LONG TERM GOAL #2   Title  The patient will report a 60% improvement in bilateral back pain, left thigh pain and right groin pain with sitting, standing, walking    Time  8    Period  Weeks    Status  New      PT LONG TERM GOAL #3   Title  The patient will have improved abdominal, trunk extensor, hip extensor and abductor strength to grossly 4/5 to 4+/5 needed for climbing flights of stairs and lifting medium objects for work.    Time  8    Period  Weeks    Status  New      PT LONG TERM GOAL #4   Title  The patient will demonstrate understanding of best body mechanics for  lifting and vacumning    Time  8    Period  Weeks    Status  New      PT LONG TERM GOAL #5   Title  FOTO functional outcome score improved from 47% limitation to 31%    Time  8    Period  Weeks    Status  New            Plan - 10/10/19 9622    Clinical Impression Statement  The patient is standing much more erect with minimal lateral shift.  Much lower pain intensity and she is able to resume stabilization and strengthening of lumbo/pelvic/hip core muscles.  She reports some abdominal discomfort with supine and prone ex's but not LBP.  Bil glute medius weakness right > left.  She would benefit from a further progression of core strengthening in standing and kneeling positions.    Rehab Potential  Good    PT Frequency  2x / week    PT Duration  8 weeks    PT Treatment/Interventions  ADLs/Self Care Home Management;Electrical Stimulation;Cryotherapy;Moist Heat;Ultrasound;Traction;Neuromuscular re-education;Therapeutic exercise;Therapeutic activities;Patient/family education;Manual techniques;Taping;Dry needling    PT Next Visit Plan  If pain level remains low progress to standing postural ex's (per pt request), standing Pallof, and kneeling lumbo/pelvic/hip strengthening    PT Home Exercise Plan  Access Code: [email or web address]       Patient will benefit from skilled therapeutic intervention in order to improve the following deficits and impairments:  Pain, Decreased activity tolerance, Decreased strength, Postural dysfunction  Visit Diagnosis: Chronic bilateral low back pain without sciatica  Muscle weakness (generalized)     Problem List There are no active problems to display for this patient.  [name], PT 10/10/19 7:46 PM  Phone: [phone] Fax: [phone] Alvera Singh 10/10/2019, 7:45 PM  [facility] [address], [place], [place], [address] Phone: [phone]   Fax:  [phone]  Name: IONA STAY MRN: [id number] Date of Birth: 12-31-1990

## 2019-10-10 NOTE — Patient Instructions (Signed)
Access Code: [email or web address]  URL: [email or web address]  Date: 10/10/2019  Prepared by: Ruben Im   Exercises  Prone Press Up - 10 reps - 1 sets - 2-3x daily - 7x weekly  Hooklying Transversus Abdominis Palpation - 10 reps - 1 sets - 10x daily - 7x weekly  Seated Transversus Abdominis Bracing - 10 reps - 3 sets - 1x daily - 7x weekly  Standing Transverse Abdominis Contraction - 10 reps - 1 sets - 1x daily - 7x weekly  Bird Dog - 10 reps - 1 sets - 1x daily - 7x weekly  Supine Bridge - 10 reps - 1 sets - 5 hold - 1x daily - 7x weekly  Right Standing Lateral Shift Correction at Wall - Repetitions - 10 reps - 1 sets - 1x daily - 7x weekly  Sidelying Transversus Abdominis Bracing - 10 reps - 3 sets - 68minutes hold - 1x daily - 7x weekly  Prone Press Up with Lateral Shifts - 10 reps - 1 sets - 1x daily - 7x weekly  Clamshell - 10 reps - 1 sets - 1x daily - 7x weekly  Side Stepping with Resistance at Thighs - 10 reps - 1 sets - 1x daily - 7x weekly  Band Walks - 10 reps - 1 sets - 1x daily - 7x weekly

## 2019-10-12 ENCOUNTER — Encounter: Payer: Self-pay | Admitting: Physical Therapy

## 2019-10-12 ENCOUNTER — Other Ambulatory Visit: Payer: Self-pay

## 2019-10-12 ENCOUNTER — Ambulatory Visit: Payer: BC Managed Care – PPO | Admitting: Physical Therapy

## 2019-10-12 DIAGNOSIS — M6281 Muscle weakness (generalized): Secondary | ICD-10-CM

## 2019-10-12 DIAGNOSIS — M545 Low back pain, unspecified: Secondary | ICD-10-CM

## 2019-10-12 DIAGNOSIS — G8929 Other chronic pain: Secondary | ICD-10-CM

## 2019-10-12 NOTE — Therapy (Signed)
[facility] [address] [place], [place], [address] Phone: [phone]   Fax:  [phone]  Physical Therapy Treatment  Patient Details  Name: Olivia Cox MRN: [id number] Date of Birth: [birth year] Referring Provider (PT): Dr. Newell Coral    Encounter Date: 10/12/2019  PT End of Session - 10/12/19 1023    Visit Number  5    Date for PT Re-Evaluation  11/23/19    Authorization Type  BCBS 60 visit limit    PT Start Time  0932    PT Stop Time  1015    PT Time Calculation (min)  43 min    Activity Tolerance  Patient tolerated treatment well       History reviewed. No pertinent past medical history.  Past Surgical History:  Procedure Laterality Date  . WISDOM TOOTH EXTRACTION      There were no vitals filed for this visit.  Subjective Assessment - 10/12/19 0933    Subjective  I feel great.  I still take muscle relaxers every day but want to get off them by next Wednesday.  I'm having a Editor, commissioning.    How long can you sit comfortably?  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  No/denies    Pain Score  0-No pain    Pain Location  Back                       [facility] Adult PT Treatment/Exercise - 10/12/19 0001      Therapeutic Activites    Therapeutic Activities  Lifting    Lifting  8# and 10# dead lifts to mid shin level 10x each weight       Lumbar Exercises: Standing   Shoulder Adduction Limitations  Pallof series red band 5x5 to each side     Other Standing Lumbar Exercises  wall posture ex's red band     Other Standing Lumbar Exercises  SLS with red band Diagonal extensions 20x       Lumbar Exercises: Prone   Other Prone Lumbar Exercises  1/2 kneel red band horizontal abduction of shoulders 15x     Other Prone Lumbar Exercises  tall kneel red band horizontal abduction 15x       Knee/Hip Exercises:  Standing   Forward Step Up Limitations  2 sets of 30 sec holding 10# weight     Other Standing Knee Exercises  blue band around thighs with side stepping 30 sec    Other Standing Knee Exercises  blue band monster walks 30 sec                PT Short Term Goals - 09/28/19 1737      PT SHORT TERM GOAL #1   Title  The patient will demonstrate understanding of basic self care, postural support when sitting and initial HEP    Time  4    Period  Weeks    Status  New    Target Date  10/26/19      PT SHORT TERM GOAL #2   Title  The patient will report a 30% reduction in bilateral low back pain as well as radiating left thigh and right groin pain with home and work ADLs    Time  4    Period  Weeks    Status  New  PT SHORT TERM GOAL #3   Title  The patient will be able to stand for 15 minutes to wash dishes, cook meals    Time  4    Period  Weeks    Status  New      PT SHORT TERM GOAL #4   Title  The patient will be able to walk her dog 30 minutes with pain level < 5/10    Time  4    Period  Weeks    Status  New        PT Long Term Goals - 09/28/19 1740      PT LONG TERM GOAL #1   Title  The patient will be independent with safe self progression of HEP for further improvements in pain/function    Time  8    Period  Weeks    Status  New    Target Date  11/23/19      PT LONG TERM GOAL #2   Title  The patient will report a 60% improvement in bilateral back pain, left thigh pain and right groin pain with sitting, standing, walking    Time  8    Period  Weeks    Status  New      PT LONG TERM GOAL #3   Title  The patient will have improved abdominal, trunk extensor, hip extensor and abductor strength to grossly 4/5 to 4+/5 needed for climbing flights of stairs and lifting medium objects for work.    Time  8    Period  Weeks    Status  New      PT LONG TERM GOAL #4   Title  The patient will demonstrate understanding of best body mechanics for lifting and  vacumning    Time  8    Period  Weeks    Status  New      PT LONG TERM GOAL #5   Title  FOTO functional outcome score improved from 47% limitation to 31%    Time  8    Period  Weeks    Status  New            Plan - 10/12/19 1849    Clinical Impression Statement  The patient has much improved back pain and reduced lateral shift (mild scoliosis exists).  Good response with a progression of core stabilization ex's in half kneeling and standing with mirror for postural feedback.  She does need therapist cues to avoid knee hyperextension with single leg standing.   She has decreased balance with SLS and frequently needs toe touch to stabilize.    PT Frequency  2x / week    PT Duration  8 weeks    PT Treatment/Interventions  ADLs/Self Care Home Management;Electrical Stimulation;Cryotherapy;Moist Heat;Ultrasound;Traction;Neuromuscular re-education;Therapeutic exercise;Therapeutic activities;Patient/family education;Manual techniques;Taping;Dry needling    PT Next Visit Plan  If pain level remains low then continue progression of  standing and kneeling lumbo/pelvic/hip strengthening;  Progression of dead lifting   PT Home Exercise Plan  Access Code: [email or web address]       Patient will benefit from skilled therapeutic intervention in order to improve the following deficits and impairments:  Pain, Decreased activity tolerance, Decreased strength, Postural dysfunction  Visit Diagnosis: Chronic bilateral low back pain without sciatica  Muscle weakness (generalized)     Problem List There are no active problems to display for this patient.  [name], PT 10/12/19 6:59 PM Phone: [phone] Fax: [phone] Vivien PrestoSimpson, Lex Linhares C 10/12/2019, 6:58 PM  [facility] [address] [place], [place], [address] Phone: [phone]   Fax:  [phone]  Name: Olivia Cox MRN: [id number] Date of Birth: 08-Jul-1991

## 2019-10-16 ENCOUNTER — Encounter: Payer: Self-pay | Admitting: Physical Therapy

## 2019-10-16 ENCOUNTER — Ambulatory Visit: Payer: BC Managed Care – PPO | Admitting: Physical Therapy

## 2019-10-16 ENCOUNTER — Other Ambulatory Visit: Payer: Self-pay

## 2019-10-16 DIAGNOSIS — M545 Low back pain, unspecified: Secondary | ICD-10-CM

## 2019-10-16 DIAGNOSIS — G8929 Other chronic pain: Secondary | ICD-10-CM

## 2019-10-16 DIAGNOSIS — M6281 Muscle weakness (generalized): Secondary | ICD-10-CM

## 2019-10-16 NOTE — Therapy (Signed)
[facility] [address] [place], [place], [address] Phone: [phone]   Fax:  [phone]  Physical Therapy Treatment  Patient Details  Name: Olivia Cox MRN: [id number] Date of Birth: [birth year] Referring Provider (PT): Dr. Sherwood Gambler    Encounter Date: 10/16/2019  PT End of Session - 10/16/19 1449    Visit Number  6    Date for PT Re-Evaluation  11/23/19    Authorization Type  BCBS 60 visit limit    PT Start Time  9381    PT Stop Time  1530    PT Time Calculation (min)  41 min    Activity Tolerance  Patient tolerated treatment well    Behavior During Therapy  [facility] for tasks assessed/performed       History reviewed. No pertinent past medical history.  Past Surgical History:  Procedure Laterality Date  . WISDOM TOOTH EXTRACTION      There were no vitals filed for this visit.  Subjective Assessment - 10/16/19 1449    Subjective  I did a lot of housework on Sat and was just a little sore that night.  Today I have some tightness in Rt thigh.    Pertinent History  scoliosis    How long can you sit comfortably?  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  standing to do dishes hurts    How long can you walk comfortably?  the more I move around the worse;  comfortably 30 minutes    Diagnostic tests  x-rays    Patient Stated Goals  work on posture; be able to function normally    Currently in Pain?  No/denies                       [facility] Adult PT Treatment/Exercise - 10/16/19 0001      Lumbar Exercises: Aerobic   UBE (Upper Arm Bike)  4', L3, standing in stagger stance alt each min fwd/bwd, switching foot stagger stance at 2', PT present to cue posture      Lumbar Exercises: Standing   Shoulder Adduction Limitations  Pallof presses multi-angle red band x 5 sets bil    Other Standing Lumbar  Exercises  BOSU ball flat side up balance with core cueing and bil UE lateral and forward raises to 90 deg alternating x 2'    Other Standing Lumbar Exercises  deadlifts bil UE 8# from low mat table x 15 reps      Knee/Hip Exercises: Standing   Extension Limitations  elbow plank on elevated table x 30 sec, add hip ext alt LEs x 3 each     Forward Step Up  Both;Step Height: 6"    Forward Step Up Limitations  2 sets of 30 sec holding 10# weight     Walking with Sports Cord  bil UE pulleys elbows at sides bent to 90 deg backward walking 30# x 10 reps    Other Standing Knee Exercises  blue band around thighs with side stepping 45 sec    Other Standing Knee Exercises  blue band monster walks 45 sec       Knee/Hip Exercises: Seated   Sit to Sand  20 reps;with UE support   last 10 holding 10#, blue band around knees  PT Short Term Goals - 09/28/19 1737      PT SHORT TERM GOAL #1   Title  The patient will demonstrate understanding of basic self care, postural support when sitting and initial HEP    Time  4    Period  Weeks    Status  New    Target Date  10/26/19      PT SHORT TERM GOAL #2   Title  The patient will report a 30% reduction in bilateral low back pain as well as radiating left thigh and right groin pain with home and work ADLs    Time  4    Period  Weeks    Status  New      PT SHORT TERM GOAL #3   Title  The patient will be able to stand for 15 minutes to wash dishes, cook meals    Time  4    Period  Weeks    Status  New      PT SHORT TERM GOAL #4   Title  The patient will be able to walk her dog 30 minutes with pain level < 5/10    Time  4    Period  Weeks    Status  New        PT Long Term Goals - 09/28/19 1740      PT LONG TERM GOAL #1   Title  The patient will be independent with safe self progression of HEP for further improvements in pain/function    Time  8    Period  Weeks    Status  New    Target Date  11/23/19      PT LONG TERM  GOAL #2   Title  The patient will report a 60% improvement in bilateral back pain, left thigh pain and right groin pain with sitting, standing, walking    Time  8    Period  Weeks    Status  New      PT LONG TERM GOAL #3   Title  The patient will have improved abdominal, trunk extensor, hip extensor and abductor strength to grossly 4/5 to 4+/5 needed for climbing flights of stairs and lifting medium objects for work.    Time  8    Period  Weeks    Status  New      PT LONG TERM GOAL #4   Title  The patient will demonstrate understanding of best body mechanics for lifting and vacumning    Time  8    Period  Weeks    Status  New      PT LONG TERM GOAL #5   Title  FOTO functional outcome score improved from 47% limitation to 31%    Time  8    Period  Weeks    Status  New            Plan - 10/16/19 1519    Clinical Impression Statement  Pt arrived having tolerated heavy housework on Sat with min soreness.  Pt arrived wanting to exercise and tolerated all ther ex without pain.  She needs min cueing to remain activated through TrA during dynamic lumbopelvic strength with LE overlay such as sidestepping and monster walks.  She was challenged with plank with hip ext and on BOSU for balance today.  She will continue to benefit from skilled PT along current plan of care with careful monitoring for pain recurrence.    Rehab Potential  Good  PT Frequency  2x / week    PT Duration  8 weeks    PT Treatment/Interventions  ADLs/Self Care Home Management;Electrical Stimulation;Cryotherapy;Moist Heat;Ultrasound;Traction;Neuromuscular re-education;Therapeutic exercise;Therapeutic activities;Patient/family education;Manual techniques;Taping;Dry needling    PT Next Visit Plan  f/u on exercise progression from last visit, continue lumbopelvic stabilization and LE strength with postural awareness    PT Home Exercise Plan  Access Code: [email or web address]    Consulted and Agree with Plan of Care  Patient        Patient will benefit from skilled therapeutic intervention in order to improve the following deficits and impairments:     Visit Diagnosis: Chronic bilateral low back pain without sciatica  Muscle weakness (generalized)     Problem List There are no active problems to display for this patient.   Loistine Simas Detrick Dani, PT 10/16/19 3:30 PM   [facility] [address] [place], [place], [address] Phone: [phone]   Fax:  [phone]  Name: Olivia Cox MRN: [id number] Date of Birth: 07/08/1991

## 2019-10-19 ENCOUNTER — Encounter: Payer: Self-pay | Admitting: Physical Therapy

## 2019-10-19 ENCOUNTER — Other Ambulatory Visit: Payer: Self-pay

## 2019-10-19 ENCOUNTER — Ambulatory Visit: Payer: BC Managed Care – PPO | Admitting: Physical Therapy

## 2019-10-19 DIAGNOSIS — M545 Low back pain, unspecified: Secondary | ICD-10-CM

## 2019-10-19 DIAGNOSIS — G8929 Other chronic pain: Secondary | ICD-10-CM

## 2019-10-19 DIAGNOSIS — M6281 Muscle weakness (generalized): Secondary | ICD-10-CM

## 2019-10-19 NOTE — Patient Instructions (Signed)
Seated Lat bar 25# 2x 10     Elliptical 5 minutes???      10# kettlebell goblet squats 2x10    10# sit to stand    2x10     Step ups 10# 2x 10 on each leg     Single leg rows 5# 2x 10      Ruben Im PT [facility] [address], [place], [place] [address] Phone # [phone] Fax [phone]

## 2019-10-19 NOTE — Therapy (Signed)
[facility] [address], [address] [place], [place], [address] Phone: [phone]   Fax:  [phone]  Physical Therapy Treatment  Patient Details  Name: Olivia Cox MRN: [id number] Date of Birth: 03-22-1991 Referring Provider (PT): Dr. Newell Coral    Encounter Date: 10/19/2019  PT End of Session - 10/19/19 1706    Visit Number  7    Date for PT Re-Evaluation  11/23/19    Authorization Type  BCBS 60 visit limit    PT Start Time  0930    PT Stop Time  1015    PT Time Calculation (min)  45 min    Activity Tolerance  Patient tolerated treatment well       History reviewed. No pertinent past medical history.  Past Surgical History:  Procedure Laterality Date  . WISDOM TOOTH EXTRACTION      There were no vitals filed for this visit.  Subjective Assessment - 10/19/19 0930    Subjective  I'm constantly feeling pinching in right low back and buttock worse at the end of the day, sitting, standing an moving a box  yesterday.  Happens sporadically.  Usually after PT I feel better then pinching returns.  No front thigh thigh pain but I had right sciatica the other day.  My joints pop a lot.    Pertinent History  scoliosis    How long can you sit comfortably?  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  Yes    Pain Score  3     Pain Location  Back    Pain Orientation  Right                       [facility] Adult PT Treatment/Exercise - 10/19/19 0001      Self-Care   Self-Care  Other Self-Care Comments    Other Self-Care Comments   used clinic SI belt while exercising however it is quite large on patient and does not seem to offer enough support      Lumbar Exercises: Aerobic   UBE (Upper Arm Bike)  4', L3, standing in stagger stance alt each min fwd/bwd,  switching foot stagger stance at 2', PT present to cue posture      Lumbar Exercises: Machines for Strengthening   Cybex Lumbar Extension  UBE standing staggered stance 5 min each direction     Other Lumbar Machine Exercise  Elliptical L5 4 min     Other Lumbar Machine Exercise  seated lat bar 25# 20x       Lumbar Exercises: Standing   Functional Squats Limitations  single leg dead lifts 10#; other foot propped on wall, chair touch for balance     Lifting  From 12";10 reps    Lifting Weights (lbs)  10    Row Limitations  Single leg standing rows 5# 10x right/left     Other Standing Lumbar Exercises  5# overhead press with neutral and staggered stance 10x each side     Other Standing Lumbar Exercises  wall sideglides 10x       Kinesiotix   Facilitate Muscle   H pattern              PT Education - 10/19/19 1704    Education  Details  pt interested in going to her gym;  at her request gave her a list of time/reps/weight    Person(s) Educated  Patient    Methods  Explanation;Demonstration;Handout    Comprehension  Verbalized understanding;Returned demonstration       PT Short Term Goals - 09/28/19 1737      PT SHORT TERM GOAL #1   Title  The patient will demonstrate understanding of basic self care, postural support when sitting and initial HEP    Time  4    Period  Weeks    Status  New    Target Date  10/26/19      PT SHORT TERM GOAL #2   Title  The patient will report a 30% reduction in bilateral low back pain as well as radiating left thigh and right groin pain with home and work ADLs    Time  4    Period  Weeks    Status  New      PT SHORT TERM GOAL #3   Title  The patient will be able to stand for 15 minutes to wash dishes, cook meals    Time  4    Period  Weeks    Status  New      PT SHORT TERM GOAL #4   Title  The patient will be able to walk her dog 30 minutes with pain level < 5/10    Time  4    Period  Weeks    Status  New        PT Long Term Goals  - 09/28/19 1740      PT LONG TERM GOAL #1   Title  The patient will be independent with safe self progression of HEP for further improvements in pain/function    Time  8    Period  Weeks    Status  New    Target Date  11/23/19      PT LONG TERM GOAL #2   Title  The patient will report a 60% improvement in bilateral back pain, left thigh pain and right groin pain with sitting, standing, walking    Time  8    Period  Weeks    Status  New      PT LONG TERM GOAL #3   Title  The patient will have improved abdominal, trunk extensor, hip extensor and abductor strength to grossly 4/5 to 4+/5 needed for climbing flights of stairs and lifting medium objects for work.    Time  8    Period  Weeks    Status  New      PT LONG TERM GOAL #4   Title  The patient will demonstrate understanding of best body mechanics for lifting and vacumning    Time  8    Period  Weeks    Status  New      PT LONG TERM GOAL #5   Title  FOTO functional outcome score improved from 47% limitation to 31%    Time  8    Period  Weeks    Status  New            Plan - 10/19/19 1708    Clinical Impression Statement  The patient has no apparent lateral shift however she intial feels shifted and needs corrective wall side gliding.  Overall she has responded positively to lumbo/pelvic/hip strengthening however she continues to lack core stability which may be a contributing factor to her transient  pinching type pain.  She may benefit from SI support until her strength and stability have improved.  Applied tape to offer some mild support and she will consider her options for purchasing a support.  She is eager to return to her gym and we discussed the equipment available there and appropriate dosage.  Verbal and tactile cues needed to avoid compensatory trunk rotation with single leg dead lifts and to avoid knee hyperextension.    Examination-Activity Limitations  Lift;Locomotion Level;Stairs;Stand;Sit     Examination-Participation Restrictions  Community Activity;Other    Rehab Potential  Good    PT Frequency  2x / week    PT Duration  8 weeks    PT Treatment/Interventions  ADLs/Self Care Home Management;Electrical Stimulation;Cryotherapy;Moist Heat;Ultrasound;Traction;Neuromuscular re-education;Therapeutic exercise;Therapeutic activities;Patient/family education;Manual techniques;Taping;Dry needling    PT Next Visit Plan  f/u on exercise progression from last visit, BOSU core strengthening;   continue lumbopelvic stabilization and LE strength with postural awareness;  Taping for SI support;  check progress with STGS    PT Home Exercise Plan  Access Code: [email or web address]       Patient will benefit from skilled therapeutic intervention in order to improve the following deficits and impairments:  Pain, Decreased activity tolerance, Decreased strength, Postural dysfunction  Visit Diagnosis: Chronic bilateral low back pain without sciatica  Muscle weakness (generalized)     Problem List There are no active problems to display for this patient.  Ruben Im, PT 10/19/19 5:17 PM Phone: [phone] Fax: [phone] Alvera Singh 10/19/2019, 5:17 PM  [facility] [address], [place], [place], [address] Phone: [phone]   Fax:  [phone]  Name: HAZLEY DEZEEUW MRN: [id number] Date of Birth: 1991/09/12

## 2019-10-22 IMAGING — DX DG LUMBAR SPINE COMPLETE 4+V
5 series · 5 of 5 positions shown · non-contrast
Comparison: None.

CLINICAL DATA: Back pain

EXAM:
LUMBAR SPINE - COMPLETE 4+ VIEW

[l-spine ap]
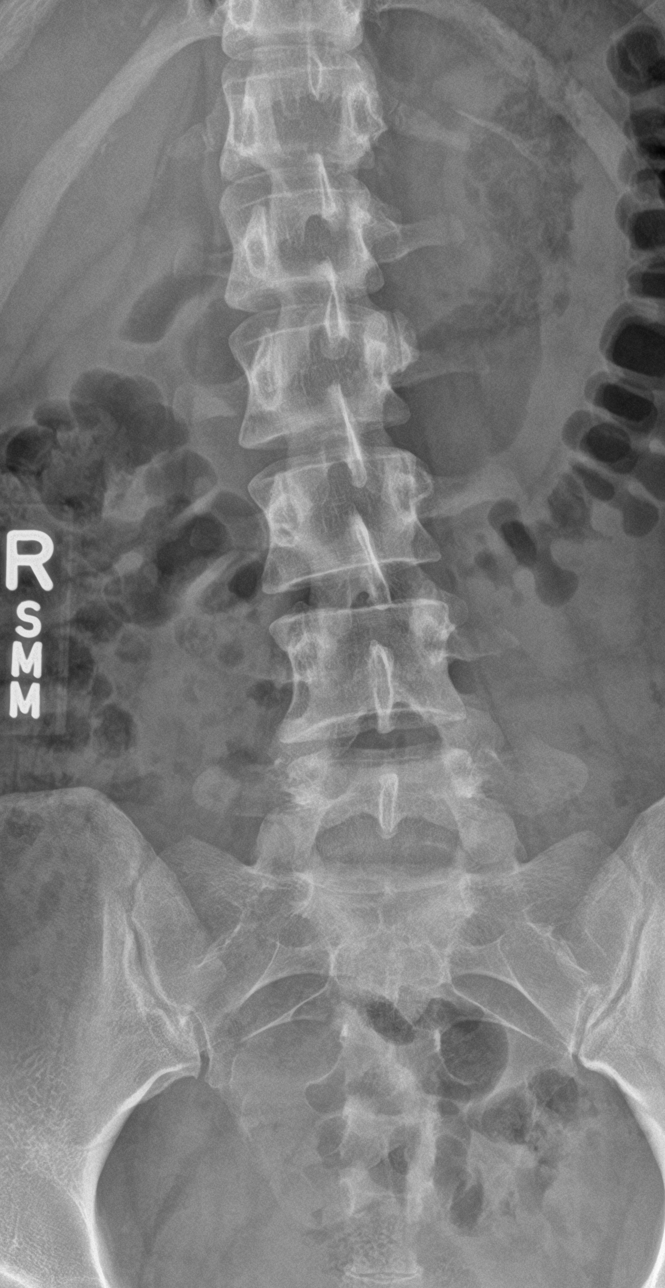

[l-spine obl (1 of 2)]
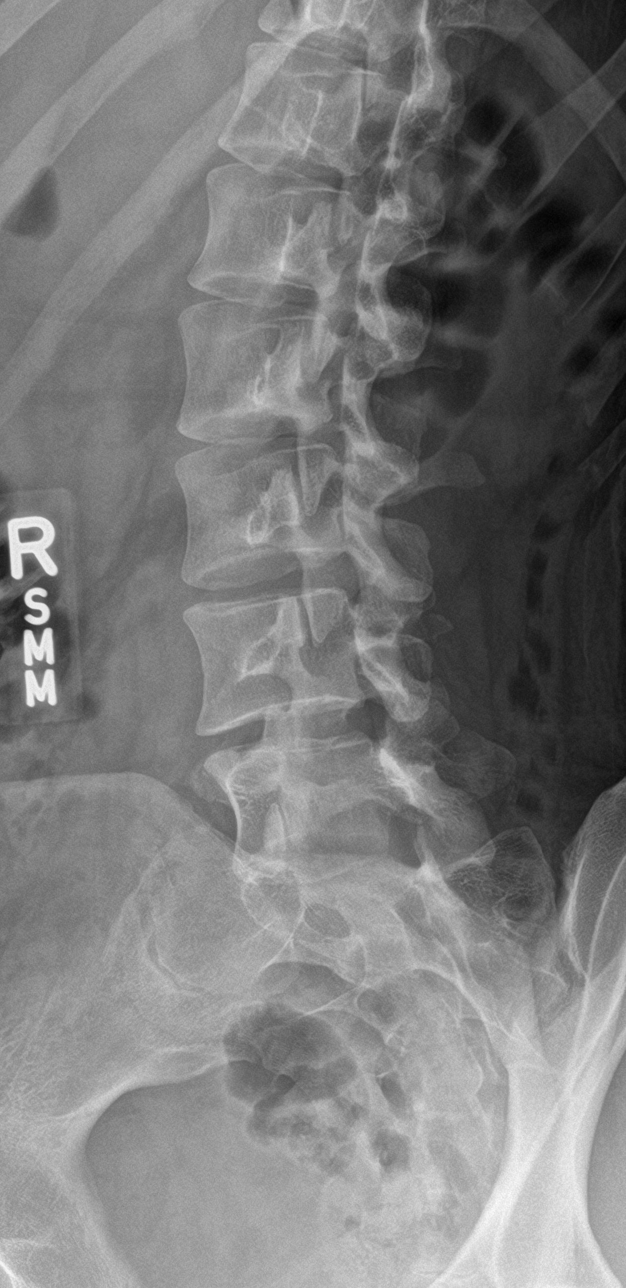

[l-spine obl (2 of 2)]
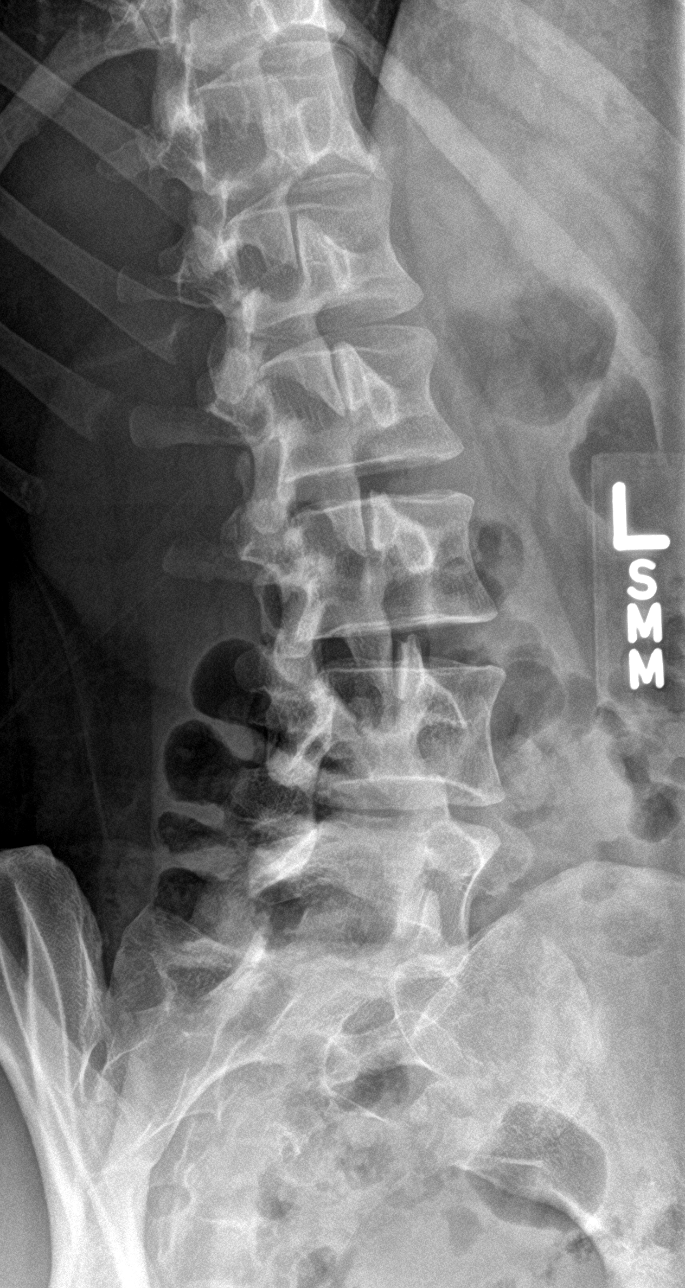

[l-spine lat]
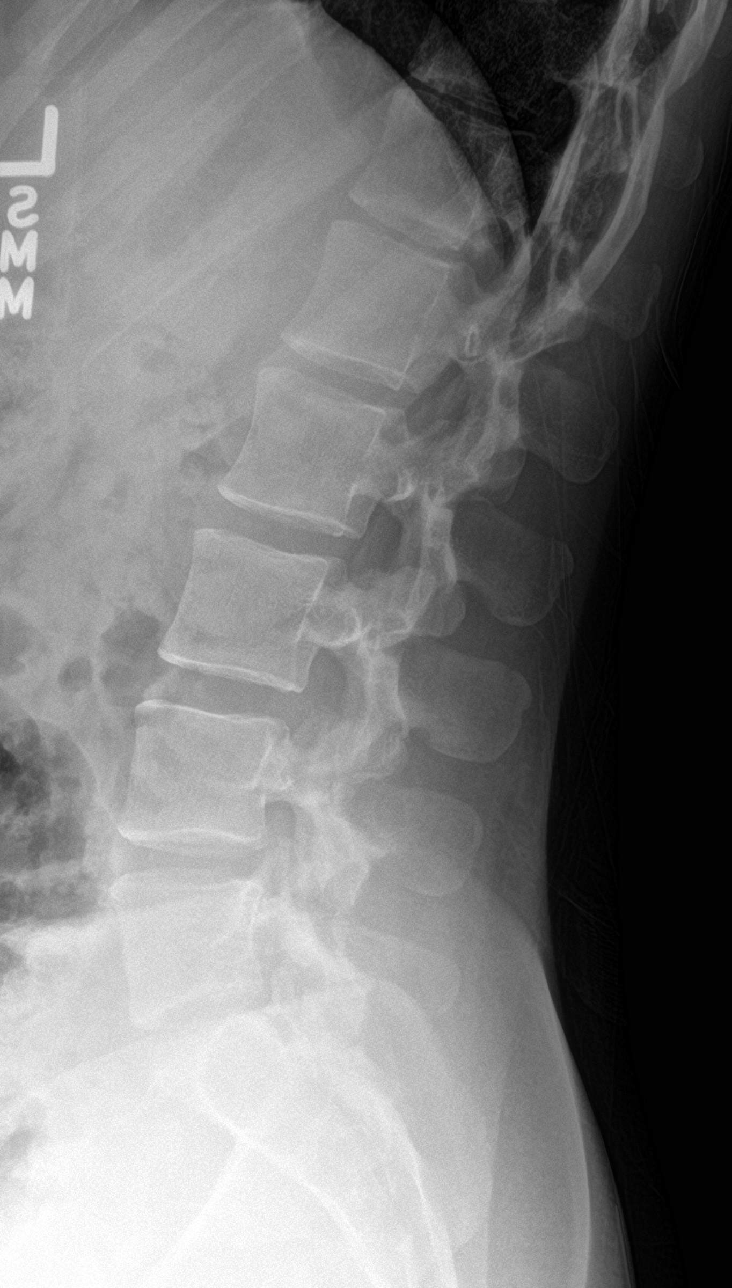

[l-spine spot]
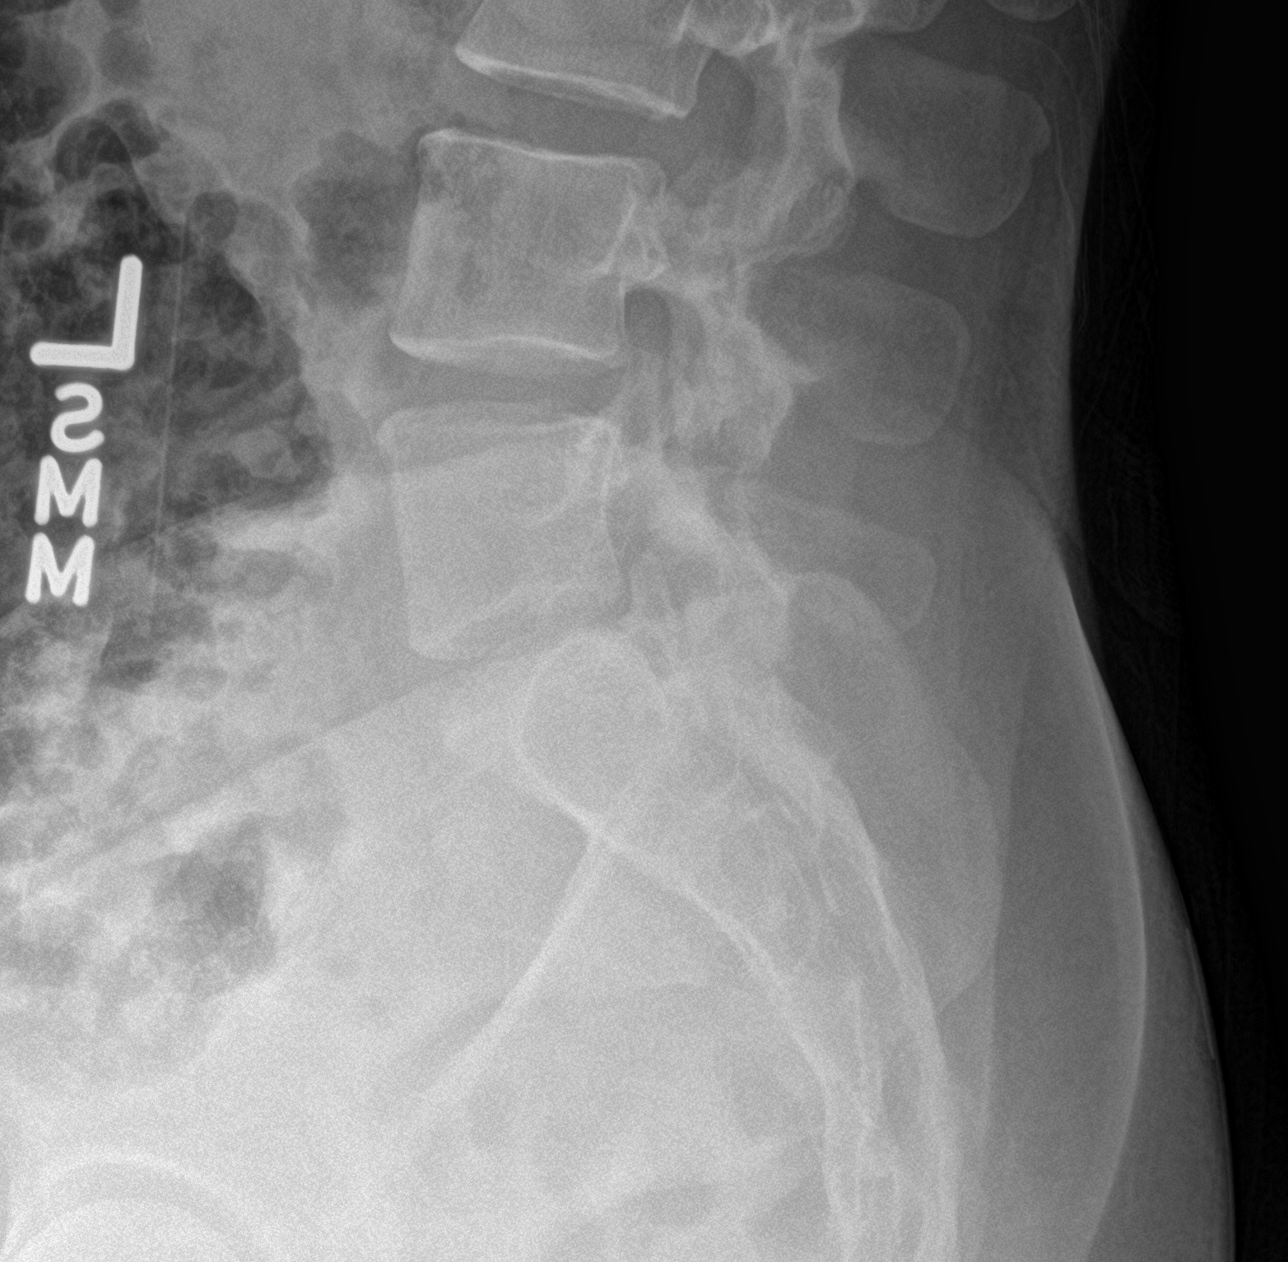

[5 of 5 positions shown; findings below may reference images not displayed]

FINDINGS: There appear to be rudimentary ribs at the T12 level. There is no
displaced fracture. The disc heights are relatively well preserved.
There is mild disc height loss at the L5-S1 level
IMPRESSION: Normal study.

## 2019-10-23 ENCOUNTER — Encounter: Payer: Self-pay | Admitting: Physical Therapy

## 2019-10-23 ENCOUNTER — Other Ambulatory Visit: Payer: Self-pay

## 2019-10-23 ENCOUNTER — Ambulatory Visit: Payer: BC Managed Care – PPO | Attending: Neurosurgery | Admitting: Physical Therapy

## 2019-10-23 ENCOUNTER — Encounter: Payer: BC Managed Care – PPO | Admitting: Physical Therapy

## 2019-10-23 DIAGNOSIS — M545 Low back pain: Secondary | ICD-10-CM | POA: Diagnosis present

## 2019-10-23 DIAGNOSIS — M6281 Muscle weakness (generalized): Secondary | ICD-10-CM | POA: Insufficient documentation

## 2019-10-23 DIAGNOSIS — G8929 Other chronic pain: Secondary | ICD-10-CM | POA: Diagnosis present

## 2019-10-23 NOTE — Therapy (Signed)
[facility] [address] [place], [place], [address] Phone: [phone]   Fax:  [phone]  Physical Therapy Treatment  Patient Details  Name: Olivia Cox MRN: [id number] Date of Birth: 05-18-1991 Referring Provider (PT): Dr. Newell Coral    Encounter Date: 10/23/2019  PT End of Session - 10/23/19 1536    Visit Number  8    Date for PT Re-Evaluation  11/23/19    Authorization Type  BCBS 60 visit limit    PT Start Time  1535    PT Stop Time  1615    PT Time Calculation (min)  40 min    Activity Tolerance  Patient tolerated treatment well    Behavior During Therapy  [facility] for tasks assessed/performed       History reviewed. No pertinent past medical history.  Past Surgical History:  Procedure Laterality Date  . WISDOM TOOTH EXTRACTION      There were no vitals filed for this visit.  Subjective Assessment - 10/23/19 1539    Subjective  I was skeptical about the tape at first, but I think it is helping reduce the pinching. I have noticed I can sit at my desk longer.    Pertinent History  scoliosis    Currently in Pain?  No/denies                       [facility] Adult PT Treatment/Exercise - 10/23/19 0001      Lumbar Exercises: Aerobic   Elliptical  R2 L5 x 6 min with status update      Lumbar Exercises: Standing   Functional Squats Limitations  Dead lifts reg to work on > pelvic thrust/Glute activation     Lifting  10 reps    Lifting Weights (lbs)  10    Lifting Limitations  from mat table, Vc to contract glute more at top    Other Standing Lumbar Exercises  Sinlge leg stance/ bird dog to pick up 5# weight 5x bil      Lumbar Exercises: Quadruped   Plank  Forearm plank 30 sec. VC for core      Knee/Hip Exercises: Standing   Walking with Sports Cord  side stepping 25# 6x each, 30# backwards walking 10x    Other Standing Knee Exercises  blue band side stepping 60 sec    Other Standing  Knee Exercises  Blue band monster walks 60 sec       Kinesiotix   Facilitate Muscle   H pattern                PT Short Term Goals - 09/28/19 1737      PT SHORT TERM GOAL #1   Title  The patient will demonstrate understanding of basic self care, postural support when sitting and initial HEP    Time  4    Period  Weeks    Status  New    Target Date  10/26/19      PT SHORT TERM GOAL #2   Title  The patient will report a 30% reduction in bilateral low back pain as well as radiating left thigh and right groin pain with home and work ADLs    Time  4    Period  Weeks    Status  New      PT SHORT TERM GOAL #3   Title  The patient will be able to stand for 15 minutes to wash dishes, cook  meals    Time  4    Period  Weeks    Status  New      PT SHORT TERM GOAL #4   Title  The patient will be able to walk her dog 30 minutes with pain level < 5/10    Time  4    Period  Weeks    Status  New        PT Long Term Goals - 09/28/19 1740      PT LONG TERM GOAL #1   Title  The patient will be independent with safe self progression of HEP for further improvements in pain/function    Time  8    Period  Weeks    Status  New    Target Date  11/23/19      PT LONG TERM GOAL #2   Title  The patient will report a 60% improvement in bilateral back pain, left thigh pain and right groin pain with sitting, standing, walking    Time  8    Period  Weeks    Status  New      PT LONG TERM GOAL #3   Title  The patient will have improved abdominal, trunk extensor, hip extensor and abductor strength to grossly 4/5 to 4+/5 needed for climbing flights of stairs and lifting medium objects for work.    Time  8    Period  Weeks    Status  New      PT LONG TERM GOAL #4   Title  The patient will demonstrate understanding of best body mechanics for lifting and vacumning    Time  8    Period  Weeks    Status  New      PT LONG TERM GOAL #5   Title  FOTO functional outcome score improved from  47% limitation to 31%    Time  8    Period  Weeks    Status  New            Plan - 10/23/19 1536    Clinical Impression Statement  Pt feels the tape is contributing to reduction of pinching pain. She is able to sit at her desk a little longer on mostdays.pt had no reports of pain with her stabilization and strength exercises that focused mainly on pelvis/glute/hip regions. Pt tried advancing her plank from at counter top to a regular plank horizontally.She did this successfully but did complain of Lt knee pain after. It is possible she hyperextended the knee or potentially quad fatigue. Kinsiotape was reapplied, skin was red so PTA verballed asked pt to take off if she felt she was getting skin irritation. She agreed verbally.    Examination-Activity Limitations  Lift;Locomotion Level;Stairs;Stand;Sit    Examination-Participation Restrictions  Community Activity;Other    Stability/Clinical Decision Making  Stable/Uncomplicated    Rehab Potential  Good    PT Frequency  2x / week    PT Duration  8 weeks    PT Treatment/Interventions  ADLs/Self Care Home Management;Electrical Stimulation;Cryotherapy;Moist Heat;Ultrasound;Traction;Neuromuscular re-education;Therapeutic exercise;Therapeutic activities;Patient/family education;Manual techniques;Taping;Dry needling    PT Next Visit Plan  Continue with core and lumbopelvic stabilization, check STGS and see if pt wants to be taped ( check skin).    PT Home Exercise Plan  Access Code: [email or web address]    Consulted and Agree with Plan of Care  Patient       Patient will benefit from skilled therapeutic intervention in order to improve the following deficits  and impairments:  Pain, Decreased activity tolerance, Decreased strength, Postural dysfunction  Visit Diagnosis: Chronic bilateral low back pain without sciatica  Muscle weakness (generalized)     Problem List There are no active problems to display for this patient.   Olivia Cox,  {PTA 10/23/2019, 4:20 PM  [facility] [address], [address] [place], [place], [address] Phone: [phone]   Fax:  [phone]  Name: Olivia Cox MRN: [id number] Date of Birth: 1991/01/10

## 2019-10-26 ENCOUNTER — Ambulatory Visit: Payer: BC Managed Care – PPO | Admitting: Physical Therapy

## 2019-10-26 ENCOUNTER — Encounter: Payer: Self-pay | Admitting: Physical Therapy

## 2019-10-26 ENCOUNTER — Other Ambulatory Visit: Payer: Self-pay

## 2019-10-26 DIAGNOSIS — M545 Low back pain: Secondary | ICD-10-CM | POA: Diagnosis not present

## 2019-10-26 DIAGNOSIS — M6281 Muscle weakness (generalized): Secondary | ICD-10-CM

## 2019-10-26 DIAGNOSIS — G8929 Other chronic pain: Secondary | ICD-10-CM

## 2019-10-26 NOTE — Therapy (Signed)
[facility] [address], [address] [place], [place], [address] Phone: [phone]   Fax:  [phone]  Physical Therapy Treatment  Patient Details  Name: COURTLYN AKI MRN: [id number] Date of Birth: 03-Feb-1991 Referring Provider (PT): Dr. Newell Coral    Encounter Date: 10/26/2019  PT End of Session - 10/26/19 1739    Visit Number  9    Date for PT Re-Evaluation  11/23/19    Authorization Type  BCBS 60 visit limit    PT Start Time  0930    PT Stop Time  1013    PT Time Calculation (min)  43 min    Activity Tolerance  Patient tolerated treatment well       History reviewed. No pertinent past medical history.  Past Surgical History:  Procedure Laterality Date  . WISDOM TOOTH EXTRACTION      There were no vitals filed for this visit.  Subjective Assessment - 10/26/19 0930    Subjective  Back is doing really good.  Every once in a while I feel weird.  Still wearing tape.  I went for a hike for the first time in a month, no pain.    Pertinent History  scoliosis    How long can you sit comfortably?  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  1 hour to do dishes    How long can you walk comfortably?  45 min at the park    Currently in Pain?  No/denies    Pain Score  0-No pain    Pain Location  Back         [facility] PT Assessment - 10/26/19 0001      Observation/Other Assessments   Focus on Therapeutic Outcomes (FOTO)   28% limitation                    [facility] Adult PT Treatment/Exercise - 10/26/19 0001      Therapeutic Activites    Lifting  25# dead lifts 8 inch 5x; 10# 10x       Neuro Re-ed    Neuro Re-ed Details   neuroscience of pain education       Lumbar Exercises: Machines for Strengthening   Leg Press  seated rows 25# 10x     Other Lumbar Machine Exercise  Elliptical L5 7 min     Other Lumbar  Machine Exercise  seated lat bar 25# 20x       Lumbar Exercises: Standing   Forward Lunge Limitations  6x each leg holding 10# weight     Other Standing Lumbar Exercises  1 min step x2 holding 10# weight     Other Standing Lumbar Exercises  single leg deadlifts with 10# 7x right/left                PT Short Term Goals - 10/26/19 0944      PT SHORT TERM GOAL #1   Title  The patient will demonstrate understanding of basic self care, postural support when sitting and initial HEP    Status  Achieved      PT SHORT TERM GOAL #2   Title  The patient will report a 30% reduction in bilateral low back pain as well as radiating left thigh and right groin pain with home and work ADLs    Time  4  Period  Weeks    Status  Achieved      PT SHORT TERM GOAL #3   Title  The patient will be able to stand for 15 minutes to wash dishes, cook meals    Status  Achieved      PT SHORT TERM GOAL #4   Title  The patient will be able to walk her dog 30 minutes with pain level < 5/10    Status  Achieved        PT Long Term Goals - 10/26/19 1743      PT LONG TERM GOAL #1   Title  The patient will be independent with safe self progression of HEP for further improvements in pain/function    Time  8    Period  Weeks    Status  On-going      PT LONG TERM GOAL #2   Title  The patient will report a 60% improvement in bilateral back pain, left thigh pain and right groin pain with sitting, standing, walking    Status  Achieved      PT LONG TERM GOAL #3   Time  8    Period  Weeks    Status  On-going      PT LONG TERM GOAL #4   Title  The patient will demonstrate understanding of best body mechanics for lifting and vacumning    Time  8    Period  Weeks    Status  On-going      PT LONG TERM GOAL #5   Title  FOTO functional outcome score improved from 47% limitation to 31%    Status  Achieved            Plan - 10/26/19 1739    Clinical Impression Statement  Significant improvement  in FOTO functional outcome score compared to initial assessment.  She has much improved pain level but reports fearfulness in lifting heavier objects continues.  She is excited to progress strengthening and able to perform dead lifts and goblet squats with good technique without exacerbation of pain.  Good response with previous KT taping.   Receptive of neuroscience of pain education.  Will decrease treatment frequency over the next month.    Rehab Potential  Good    PT Frequency  2x / week    PT Treatment/Interventions  ADLs/Self Care Home Management;Electrical Stimulation;Cryotherapy;Moist Heat;Ultrasound;Traction;Neuromuscular re-education;Therapeutic exercise;Therapeutic activities;Patient/family education;Manual techniques;Taping;Dry needling    PT Next Visit Plan  Continue with core and lumbopelvic stabilization    PT Home Exercise Plan  Access Code: [email or web address]       Patient will benefit from skilled therapeutic intervention in order to improve the following deficits and impairments:  Pain, Decreased activity tolerance, Decreased strength, Postural dysfunction  Visit Diagnosis: Chronic bilateral low back pain without sciatica  Muscle weakness (generalized)     Problem List There are no active problems to display for this patient.  Ruben Im, PT 10/26/19 5:45 PM Phone: [phone] Fax: [phone] Alvera Singh 10/26/2019, 5:45 PM  [facility] [address] [place], [place], [address] Phone: [phone]   Fax:  [phone]  Name: ELISABELLA HACKER MRN: [id number] Date of Birth: 1991/12/12

## 2019-10-30 ENCOUNTER — Encounter: Payer: Self-pay | Admitting: Physical Therapy

## 2019-10-30 ENCOUNTER — Ambulatory Visit: Payer: BC Managed Care – PPO | Admitting: Physical Therapy

## 2019-10-30 ENCOUNTER — Other Ambulatory Visit: Payer: Self-pay

## 2019-10-30 DIAGNOSIS — M6281 Muscle weakness (generalized): Secondary | ICD-10-CM

## 2019-10-30 DIAGNOSIS — M545 Low back pain, unspecified: Secondary | ICD-10-CM

## 2019-10-30 DIAGNOSIS — G8929 Other chronic pain: Secondary | ICD-10-CM

## 2019-10-30 NOTE — Therapy (Signed)
[facility] [address], [place], [place], [address] Phone: [phone]   Fax:  [phone]  Physical Therapy Treatment  Patient Details  Name: Olivia Cox MRN: [id number] Date of Birth: 1991-01-06 Referring Provider (PT): Dr. Sherwood Gambler    Encounter Date: 10/30/2019  PT End of Session - 10/30/19 1447    Visit Number  10    Date for PT Re-Evaluation  11/23/19    Authorization Type  BCBS 60 visit limit    PT Start Time  1448    PT Stop Time  1527    PT Time Calculation (min)  39 min       History reviewed. No pertinent past medical history.  Past Surgical History:  Procedure Laterality Date  . WISDOM TOOTH EXTRACTION      There were no vitals filed for this visit.  Subjective Assessment - 10/30/19 1450    Subjective  I feel like I have hit a wall the last few days. I feel the same, a little pinchy can't sit for too long of a period.    Currently in Pain?  Yes    Pain Score  3     Pain Location  Back    Pain Orientation  Right;Left;Lower    Pain Descriptors / Indicators  --   pinching   Aggravating Factors   sitting for a long time    Pain Relieving Factors  laying down, KT tape                       [facility] Adult PT Treatment/Exercise - 10/30/19 0001      Ambulation/Gait   Gait Comments  ambulate with contracting the lower abdominals as the leg moves forward      Lumbar Exercises: Aerobic   Elliptical  R3 L3 x 6 min for warm up      Lumbar Exercises: Standing   Other Standing Lumbar Exercises  single leg deadlifts with 10# 10x right/left    VC to contract the glutes and lower abs more     Knee/Hip Exercises: Standing   Gait Training  Ambulating with more engagement of lower abs on the LT      Manual Therapy   Manual Therapy  Joint mobilization;Soft tissue mobilization;Muscle Energy Technique;Taping    Manual therapy comments  prone contract relax ot left hip for rotation,  manually stretched left hip flexor and ITB band   done by Earlie Counts, PT   Joint Mobilization  P-A and rotational mobilization to L1-L5 grade III in prone, sacral mobilizaiton to correct left rotation, left hip mobilization for inferior and lateral glide in supine and anterior glide in prone grade III   done by Earlie Counts, PT   Soft tissue mobilization  stretch of the left sacral sulcus in supine with distraction   done by Earlie Counts, PT   Muscle Energy Technique  to correct left posterior rotated ilium   done by Earlie Counts, PT     Kinesiotix   Facilitate Muscle   H pattern                PT Short Term Goals - 10/26/19 0944      PT SHORT TERM GOAL #1   Title  The patient will demonstrate understanding of basic self care, postural support when sitting and initial HEP    Status  Achieved      PT SHORT TERM GOAL #2   Title  The patient will report a 30% reduction in bilateral low back pain as well as radiating left thigh and right groin pain with home and work ADLs    Time  4    Period  Weeks    Status  Achieved      PT SHORT TERM GOAL #3   Title  The patient will be able to stand for 15 minutes to wash dishes, cook meals    Status  Achieved      PT SHORT TERM GOAL #4   Title  The patient will be able to walk her dog 30 minutes with pain level < 5/10    Status  Achieved        PT Long Term Goals - 10/26/19 1743      PT LONG TERM GOAL #1   Title  The patient will be independent with safe self progression of HEP for further improvements in pain/function    Time  8    Period  Weeks    Status  On-going      PT LONG TERM GOAL #2   Title  The patient will report a 60% improvement in bilateral back pain, left thigh pain and right groin pain with sitting, standing, walking    Status  Achieved      PT LONG TERM GOAL #3   Time  8    Period  Weeks    Status  On-going      PT LONG TERM GOAL #4   Title  The patient will demonstrate understanding of best body  mechanics for lifting and vacumning    Time  8    Period  Weeks    Status  On-going      PT LONG TERM GOAL #5   Title  FOTO functional outcome score improved from 47% limitation to 31%    Status  Achieved            Plan - 10/30/19 1449    Clinical Impression Statement  Pt arrives today reporting her "pelvis felt off" and pinchy again. She was unsure why. Her pain was not at all back to what it was but she was disappointed her pain returning. PT was available to assess the pelvic and SI where she was able to perform mobilizations and stretches to these areas. Pt reports this helped a lot.    Examination-Activity Limitations  Lift;Locomotion Level;Stairs;Stand;Sit    Examination-Participation Restrictions  Community Activity;Other    Stability/Clinical Decision Making  Stable/Uncomplicated    Rehab Potential  Good    PT Frequency  2x / week    PT Duration  8 weeks    PT Treatment/Interventions  ADLs/Self Care Home Management;Electrical Stimulation;Cryotherapy;Moist Heat;Ultrasound;Traction;Neuromuscular re-education;Therapeutic exercise;Therapeutic activities;Patient/family education;Manual techniques;Taping;Dry needling    PT Next Visit Plan  Continue with core and lumbopelvic stabilization    PT Home Exercise Plan  Access Code: [email or web address]    Consulted and Agree with Plan of Care  Patient       Patient will benefit from skilled therapeutic intervention in order to improve the following deficits and impairments:  Pain, Decreased activity tolerance, Decreased strength, Postural dysfunction  Visit Diagnosis: Chronic bilateral low back pain without sciatica  Muscle weakness (generalized)     Problem List There are no active problems to display for this patient.   Timiya Howells, PTA 10/30/2019, 4:22 PM  [facility] [address], [address] [place], [place], [address] Phone: [phone]   Fax:  [phone]  Name:  Erick BlinksGabrielle M Nieland MRN: [id number] Date of Birth: 1991-05-26

## 2019-11-03 ENCOUNTER — Ambulatory Visit: Payer: BC Managed Care – PPO | Admitting: Physical Therapy

## 2019-11-03 ENCOUNTER — Other Ambulatory Visit: Payer: Self-pay

## 2019-11-03 ENCOUNTER — Encounter: Payer: Self-pay | Admitting: Physical Therapy

## 2019-11-03 DIAGNOSIS — M6281 Muscle weakness (generalized): Secondary | ICD-10-CM

## 2019-11-03 DIAGNOSIS — G8929 Other chronic pain: Secondary | ICD-10-CM

## 2019-11-03 DIAGNOSIS — M545 Low back pain, unspecified: Secondary | ICD-10-CM

## 2019-11-03 NOTE — Therapy (Signed)
[facility] [address] [place], [place], [address] Phone: [phone]   Fax:  [phone]  Physical Therapy Treatment  Patient Details  Name: Olivia Cox MRN: [id number] Date of Birth: [birth year] Referring Provider (PT): Dr. Sherwood Gambler    Encounter Date: 11/03/2019  PT End of Session - 11/03/19 1214    Visit Number  11    Date for PT Re-Evaluation  11/23/19    Authorization Type  BCBS 60 visit limit    PT Start Time  0932    PT Stop Time  1013    PT Time Calculation (min)  41 min    Activity Tolerance  Patient tolerated treatment well       History reviewed. No pertinent past medical history.  Past Surgical History:  Procedure Laterality Date  . WISDOM TOOTH EXTRACTION      There were no vitals filed for this visit.  Subjective Assessment - 11/03/19 1206    Subjective  I've exercised at the gym every day this week.  Some left > right pinching with walking or sitting too long.    Currently in Pain?  No/denies    Pain Score  0-No pain    Pain Location  Back    Pain Orientation  Left         [facility] PT Assessment - 11/03/19 0001      Posture/Postural Control   Posture Comments  no shift or pelvic rotation       Special Tests   Other special tests  SLR with SI gapping and compression no change                    [facility] Adult PT Treatment/Exercise - 11/03/19 0001      Self-Care   Other Self-Care Comments   discussed SI belt options if tape helps       Lumbar Exercises: Aerobic   UBE (Upper Arm Bike)  2 min standing alternating directions 2 min       Lumbar Exercises: Standing   Wall Slides  5 reps;5 seconds    Wall Slides Limitations  red ball     Other Standing Lumbar Exercises  review of HEP       Lumbar Exercises: Supine   Other Supine Lumbar Exercises  holding red band between thighs and hands dead bug 12x    Other Supine Lumbar Exercises  small ball pass from between  knees to hands overhead 10x       Lumbar Exercises: Quadruped   Other Quadruped Lumbar Exercises  trunk extension off ball holds 20sec 5x       Kinesiotix   Facilitate Muscle   Bil ASIS to sacrum and horizontally across lower abdominals                PT Short Term Goals - 10/26/19 0944      PT SHORT TERM GOAL #1   Title  The patient will demonstrate understanding of basic self care, postural support when sitting and initial HEP    Status  Achieved      PT SHORT TERM GOAL #2   Title  The patient will report a 30% reduction in bilateral low back pain as well as radiating left thigh and right groin pain with home and work ADLs    Time  4    Period  Weeks    Status  Achieved      PT SHORT TERM GOAL #3  Title  The patient will be able to stand for 15 minutes to wash dishes, cook meals    Status  Achieved      PT SHORT TERM GOAL #4   Title  The patient will be able to walk her dog 30 minutes with pain level < 5/10    Status  Achieved        PT Long Term Goals - 10/26/19 1743      PT LONG TERM GOAL #1   Title  The patient will be independent with safe self progression of HEP for further improvements in pain/function    Time  8    Period  Weeks    Status  On-going      PT LONG TERM GOAL #2   Title  The patient will report a 60% improvement in bilateral back pain, left thigh pain and right groin pain with sitting, standing, walking    Status  Achieved      PT LONG TERM GOAL #3   Time  8    Period  Weeks    Status  On-going      PT LONG TERM GOAL #4   Title  The patient will demonstrate understanding of best body mechanics for lifting and vacumning    Time  8    Period  Weeks    Status  On-going      PT LONG TERM GOAL #5   Title  FOTO functional outcome score improved from 47% limitation to 31%    Status  Achieved            Plan - 11/03/19 1215    Clinical Impression Statement  Erect posture without shift or pelvic obliquity.  She has been able to  continue lumbo/pelvic/hip strengthening at the gym on a daily basis this week.  No pain while she is exercising but when she walks down the hall she sometimes gets a pinching sensation on the the left, sometimes right.  Applied KT tape in a fashion to offer SI support and to activate lower abdominals.  She does not report an pinching with walking laps in the clinic or with core ex's performed today.    Rehab Potential  Good    PT Frequency  2x / week    PT Duration  8 weeks    PT Treatment/Interventions  ADLs/Self Care Home Management;Electrical Stimulation;Cryotherapy;Moist Heat;Ultrasound;Traction;Neuromuscular re-education;Therapeutic exercise;Therapeutic activities;Patient/family education;Manual techniques;Taping;Dry needling    PT Next Visit Plan  Continue with core and lumbopelvic stabilization; assess response to KT for SI support    PT Home Exercise Plan  Access Code: [email or web address]       Patient will benefit from skilled therapeutic intervention in order to improve the following deficits and impairments:  Pain, Decreased activity tolerance, Decreased strength, Postural dysfunction  Visit Diagnosis: Chronic bilateral low back pain without sciatica  Muscle weakness (generalized)     Problem List There are no active problems to display for this patient. [name], PT 11/03/19 12:20 PM Phone: [phone] Fax: [phone] [name] 11/03/2019, 12:20 PM  [facility] [address], [address] [place], [place], [address] Phone: [phone]   Fax:  [phone]  Name: Olivia Cox MRN: [id number] Date of Birth: [birth year]

## 2019-11-07 ENCOUNTER — Encounter: Payer: Self-pay | Admitting: Physical Therapy

## 2019-11-07 ENCOUNTER — Ambulatory Visit: Payer: BC Managed Care – PPO | Admitting: Physical Therapy

## 2019-11-07 ENCOUNTER — Other Ambulatory Visit: Payer: Self-pay

## 2019-11-07 DIAGNOSIS — M545 Low back pain: Secondary | ICD-10-CM | POA: Diagnosis not present

## 2019-11-07 DIAGNOSIS — M6281 Muscle weakness (generalized): Secondary | ICD-10-CM

## 2019-11-07 DIAGNOSIS — G8929 Other chronic pain: Secondary | ICD-10-CM

## 2019-11-07 NOTE — Therapy (Addendum)
[facility] [address] [place], [place], [address] Phone: [phone]   Fax:  [phone]  Physical Therapy Treatment/Discharge Summary   Patient Details  Name: Olivia Cox MRN: [id number] Date of Birth: 1990-12-27 Referring Provider (PT): Dr. Sherwood Gambler    Encounter Date: 11/07/2019  PT End of Session - 11/07/19 1704    Visit Number  12    Date for PT Re-Evaluation  11/23/19    Authorization Type  BCBS 60 visit limit    PT Start Time  1529    PT Stop Time  1615    PT Time Calculation (min)  46 min    Activity Tolerance  Patient tolerated treatment well       History reviewed. No pertinent past medical history.  Past Surgical History:  Procedure Laterality Date  . WISDOM TOOTH EXTRACTION      There were no vitals filed for this visit.  Subjective Assessment - 11/07/19 1530    Subjective  Got an SI belt yesterday.  More stability but still pinching.  I overdid it yesterday working out.  Not sore today.  This morning back to normal.  Walked 45 min walk over the weekend.    Currently in Pain?  No/denies    Pain Score  0-No pain    Pain Location  Back         [facility] PT Assessment - 11/07/19 0001      Observation/Other Assessments   Focus on Therapeutic Outcomes (FOTO)   28% limitation       Posture/Postural Control   Posture Comments  no shift or pelvic rotation however pt states she feels "off"       AROM   Lumbar Flexion  65    Lumbar Extension  25    Lumbar - Right Side Bend  45    Lumbar - Left Side Bend  40                   [facility] Adult PT Treatment/Exercise - 11/07/19 0001      Self-Care   Other Self-Care Comments   review of where to place SI belt; review of gym program and discussion of modification to avoid "overdoing it"       Lumbar Exercises: Stretches   Other Lumbar Stretch Exercise  childs pose with and without bias       Lumbar Exercises: Standing   Other  Standing Lumbar Exercises  standing on BOSU with UE movements; 5# plyoball chops and lifts     Other Standing Lumbar Exercises  25# double and single arm row with same side knee lift 3x 5 reps       Lumbar Exercises: Quadruped   Other Quadruped Lumbar Exercises  1/2 kneel with red band anti rotatory presses forward and overhead     Other Quadruped Lumbar Exercises  tall kneeling bil red band extensions and flexion UE       Knee/Hip Exercises: Standing   Forward Lunges Limitations  walking lunges 3 laps holding 5# weights in each hand       Manual Therapy   Muscle Energy Technique  right and left hip flexion isometric 5sec hold 5x;  hip adduction isometric 5x 5 sec holds in 90/90 position                PT Short Term Goals - 10/26/19 0944      PT SHORT TERM GOAL #1   Title  The patient  will demonstrate understanding of basic self care, postural support when sitting and initial HEP    Status  Achieved      PT SHORT TERM GOAL #2   Title  The patient will report a 30% reduction in bilateral low back pain as well as radiating left thigh and right groin pain with home and work ADLs    Time  4    Period  Weeks    Status  Achieved      PT SHORT TERM GOAL #3   Title  The patient will be able to stand for 15 minutes to wash dishes, cook meals    Status  Achieved      PT SHORT TERM GOAL #4   Title  The patient will be able to walk her dog 30 minutes with pain level < 5/10    Status  Achieved        PT Long Term Goals - 11/07/19 1711      PT LONG TERM GOAL #1   Title  The patient will be independent with safe self progression of HEP for further improvements in pain/function    Time  8    Period  Weeks    Status  On-going      PT LONG TERM GOAL #2   Title  The patient will report a 60% improvement in bilateral back pain, left thigh pain and right groin pain with sitting, standing, walking    Status  Achieved      PT LONG TERM GOAL #3   Title  The patient will have  improved abdominal, trunk extensor, hip extensor and abductor strength to grossly 4/5 to 4+/5 needed for climbing flights of stairs and lifting medium objects for work.    Period  Weeks    Status  On-going      PT LONG TERM GOAL #4   Title  The patient will demonstrate understanding of best body mechanics for lifting and vacumning    Status  Achieved      PT LONG TERM GOAL #5   Title  FOTO functional outcome score improved from 47% limitation to 31%    Status  Achieved            Plan - 11/07/19 1704    Clinical Impression Statement  The patient reports "no change" with pinching sensation in her low back with prolonged sitting and walking.  She has made significant improvement in lumbar ROM, FOTO functional outcome score and she has been able to peform a moderate intensity lumbo/pelvic/hip strengthening ex program.  She is able to perform dead lifting and single leg dead lifting with 20# without production of back pain.  She has had lumbopelvic obliquities which have improved with lateral shifting and muscle energy techniques.  We have also used taping and a SI for stability.  We have discussed decreasing frequency of appts to promote HEP independence and to allow time for strength changes to occur.  Will follow up in 2 weeks.    Rehab Potential  Good    PT Frequency  2x / week    PT Duration  8 weeks    PT Treatment/Interventions  ADLs/Self Care Home Management;Electrical Stimulation;Cryotherapy;Moist Heat;Ultrasound;Traction;Neuromuscular re-education;Therapeutic exercise;Therapeutic activities;Patient/family education;Manual techniques;Taping;Dry needling    PT Next Visit Plan  see how appt with Dr. Sherwood Gambler went;  Continue with core and lumbopelvic stabilization;  follow up in 2 weeks    PT Home Exercise Plan  Access Code: [email or web address]  Patient will benefit from skilled therapeutic intervention in order to improve the following deficits and impairments:  Pain, Decreased activity  tolerance, Decreased strength, Postural dysfunction  Visit Diagnosis: Chronic bilateral low back pain without sciatica  Muscle weakness (generalized)    PHYSICAL THERAPY DISCHARGE SUMMARY  Visits from Start of Care: 12  Current functional level related to goals / functional outcomes: The patient cancelled her last scheduled appt secondary to a covid exposure.  She planned to discuss her status with her doctor to determine if further PT needed.  She has not returned since November.  Will discharge from PT at this time.     Remaining deficits: See clinical impressions above  Education / Equipment: HEP  Plan:                                                    Patient goals were not met. Patient is being discharged due to not returning since the last visit.  ?????      Problem List There are no active problems to display for this patient.  Ruben Im, PT 11/07/19 5:14 PM Phone: [phone] Fax: [phone] Alvera Singh 11/07/2019, 5:13 PM  [facility] [address], [place], [place], [address] Phone: [phone]   Fax:  [phone]  Name: KIMMIE BERGGREN MRN: [id number] Date of Birth: Apr 23, 1991

## 2019-11-23 ENCOUNTER — Encounter: Payer: BC Managed Care – PPO | Admitting: Physical Therapy

## 2020-01-22 ENCOUNTER — Other Ambulatory Visit: Payer: Self-pay | Admitting: Orthopaedic Surgery

## 2020-01-22 DIAGNOSIS — M41126 Adolescent idiopathic scoliosis, lumbar region: Secondary | ICD-10-CM

## 2020-01-24 ENCOUNTER — Other Ambulatory Visit: Payer: Self-pay

## 2020-01-24 ENCOUNTER — Ambulatory Visit
Admission: RE | Admit: 2020-01-24 | Discharge: 2020-01-24 | Disposition: A | Discharge status: Home / Self Care | Payer: BC Managed Care – PPO | Source: Ambulatory Visit | Attending: Orthopaedic Surgery | Admitting: Orthopaedic Surgery

## 2020-01-24 DIAGNOSIS — M41126 Adolescent idiopathic scoliosis, lumbar region: Secondary | ICD-10-CM

## 2020-01-27 ENCOUNTER — Other Ambulatory Visit: Payer: BC Managed Care – PPO

## 2020-09-17 ENCOUNTER — Other Ambulatory Visit: Payer: Self-pay | Admitting: Gastroenterology

## 2020-09-17 DIAGNOSIS — R112 Nausea with vomiting, unspecified: Secondary | ICD-10-CM

## 2020-09-25 ENCOUNTER — Ambulatory Visit
Admission: RE | Admit: 2020-09-25 | Discharge: 2020-09-25 | Disposition: A | Discharge status: Home / Self Care | Payer: BC Managed Care – PPO | Source: Ambulatory Visit | Attending: Gastroenterology | Admitting: Gastroenterology

## 2020-09-25 DIAGNOSIS — R112 Nausea with vomiting, unspecified: Secondary | ICD-10-CM

## 2020-10-27 IMAGING — US US ABDOMEN COMPLETE
1 series · 14 of 25 positions shown · non-contrast
Comparison: None.

CLINICAL DATA: Nausea and vomiting over the past month

EXAM:
ABDOMEN ULTRASOUND COMPLETE

[Series 1: us abdomen complete · 0.20mm/px · 14 of 88 slices shown]
[im 1/88]
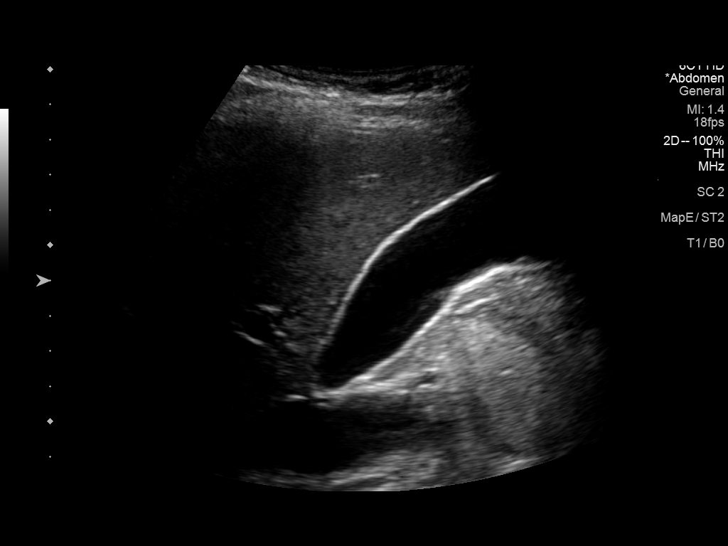
[im 8/88]
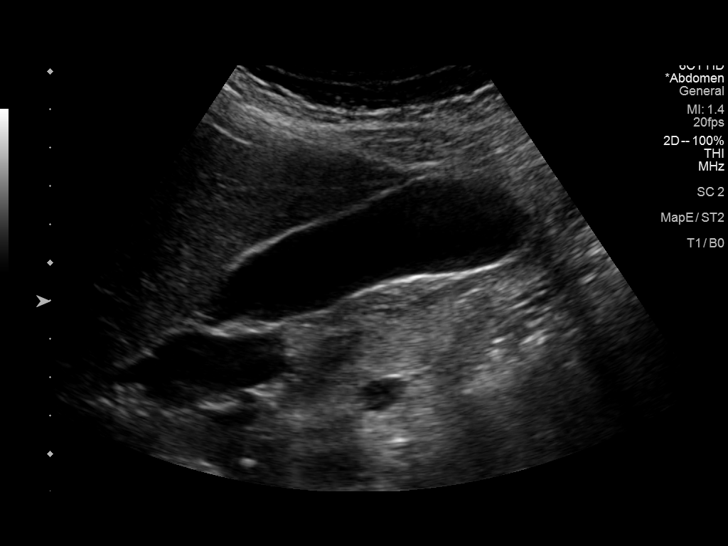
[im 15/88]
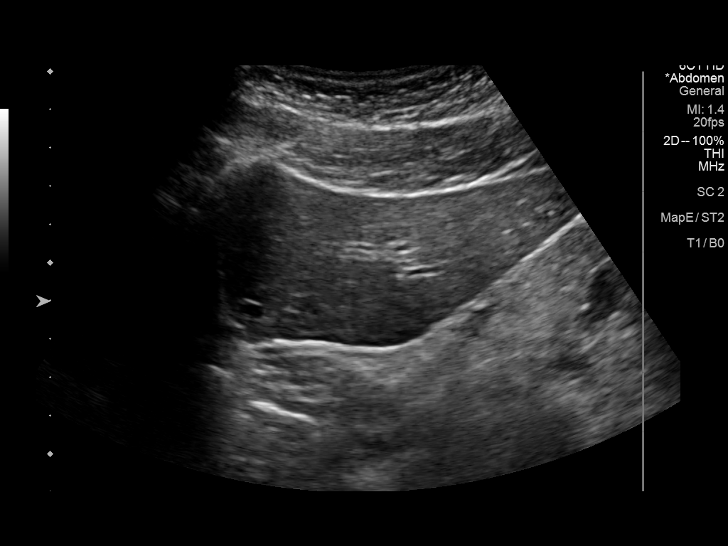
[im 22/88]
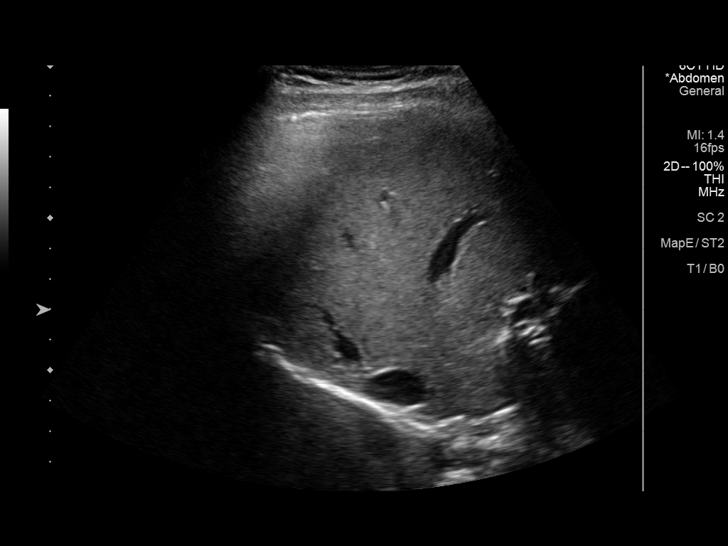
[im 30/88]
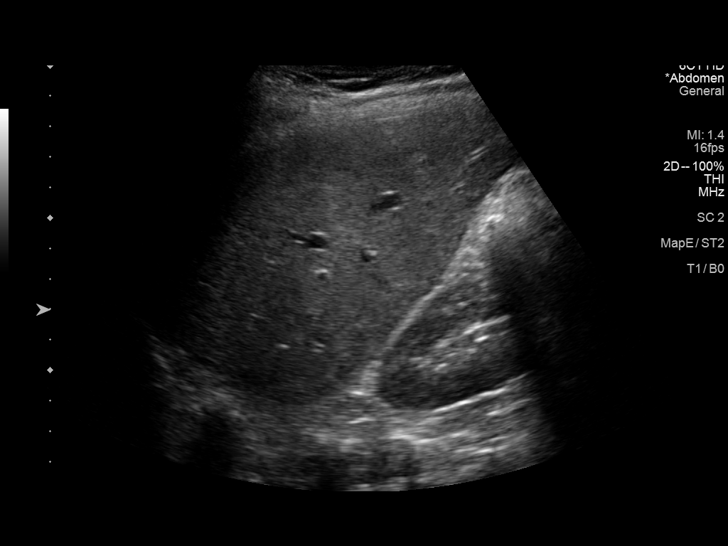
[im 33/88]
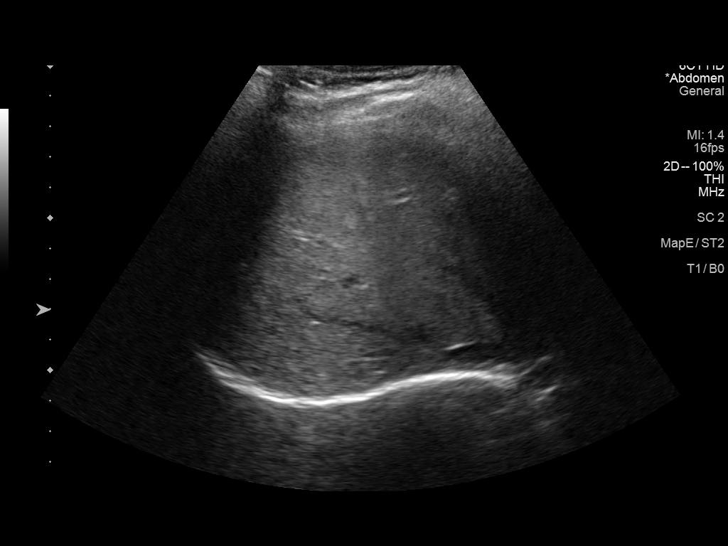
[im 40/88]
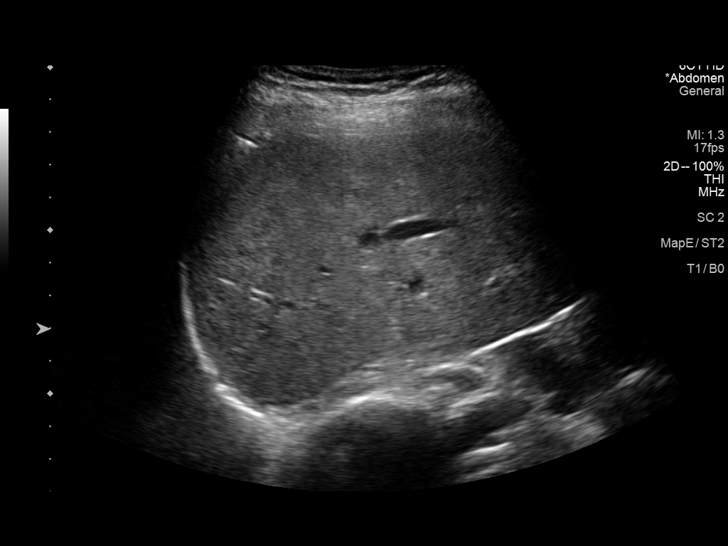
[im 48/88]
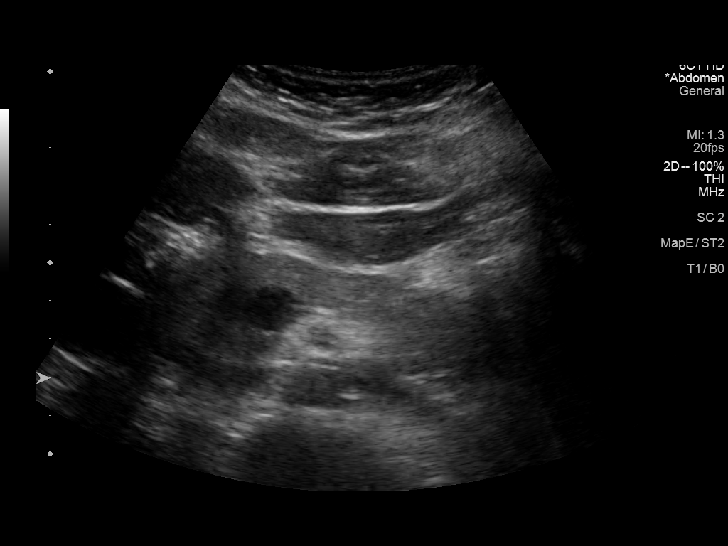
[im 55/88]
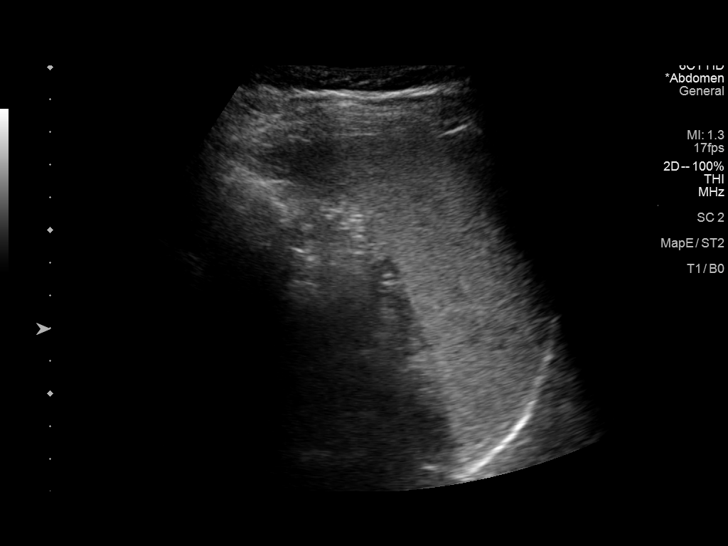
[im 59/88]
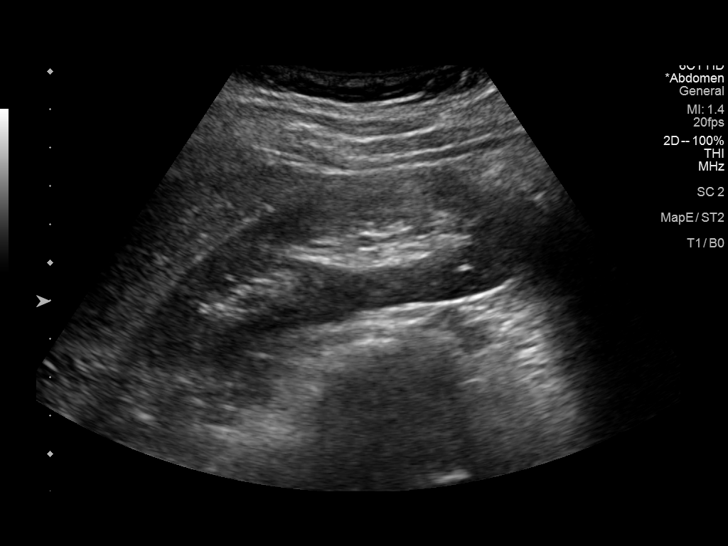
[im 66/88]
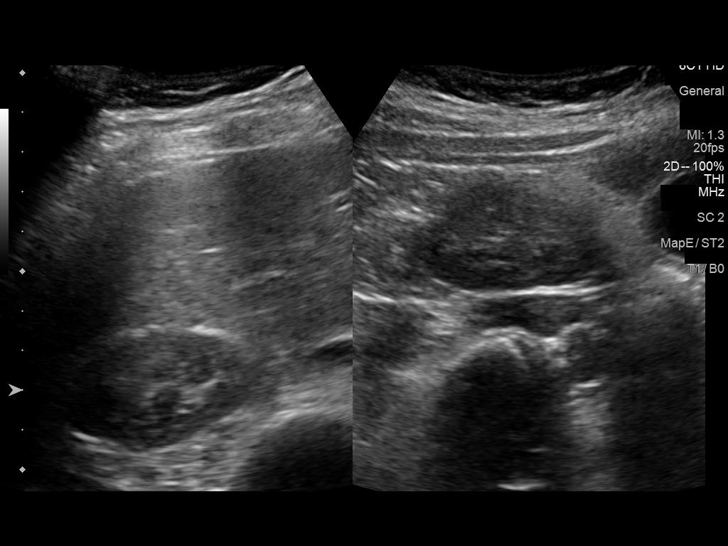
[im 73/88]
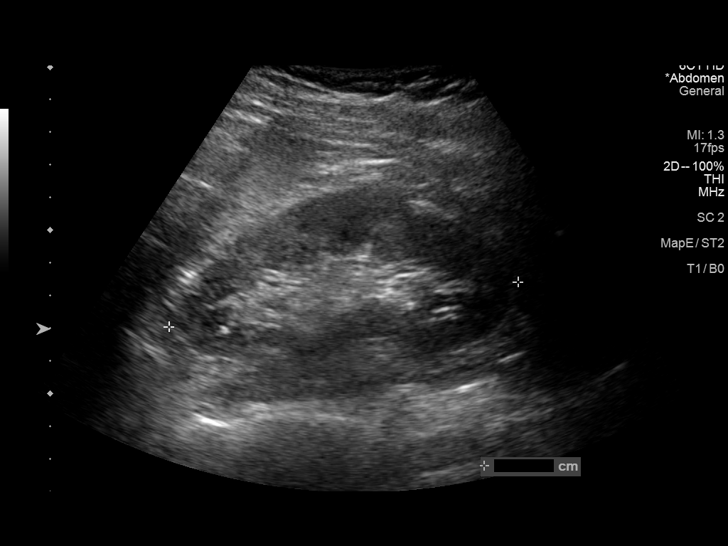
[im 80/88]
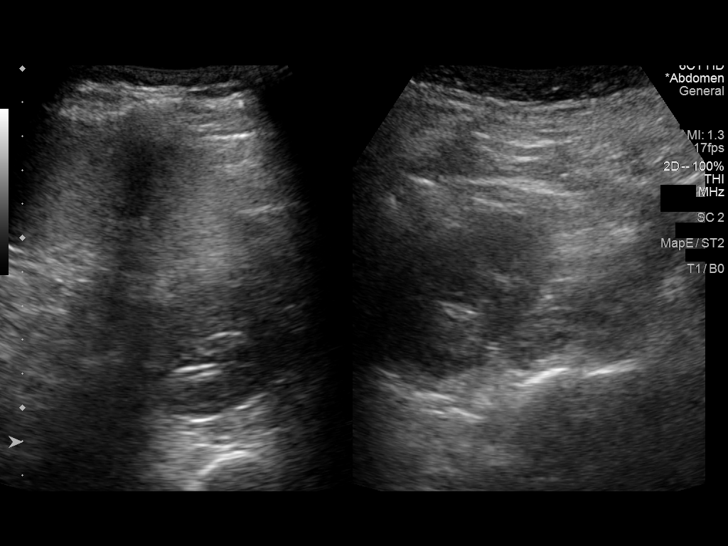
[im 88/88]
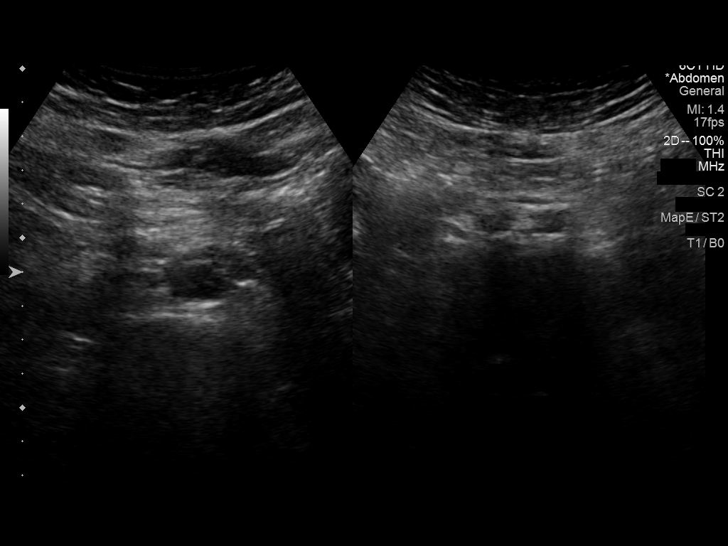

[14 of 25 positions shown; findings below may reference images not displayed]

FINDINGS: Gallbladder: No gallstones or wall thickening visualized. No
sonographic Murphy sign noted by sonographer.

Common bile duct: Diameter: 0.3 cm

Liver: No focal lesion identified. Within normal limits in
parenchymal echogenicity. Portal vein is patent on color Doppler
imaging with normal direction of blood flow towards the liver.

IVC: No abnormality visualized.

Pancreas: Visualized portion unremarkable. Parts of the pancreatic
head and distal tail are obscured by overlying bowel gas.

Spleen: Size and appearance within normal limits.

Right Kidney: Length: 11.1 cm. Echogenicity within normal limits. No
mass or hydronephrosis visualized.

Left Kidney: Length: 10.8 cm. Echogenicity within normal limits. No
mass or hydronephrosis visualized.

Abdominal aorta: No aneurysm visualized.

Other findings: None.
IMPRESSION: 1. No specific sonographic abnormality is identified to explain the
patient's symptoms.

## 2021-04-28 ENCOUNTER — Encounter: Payer: BC Managed Care – PPO | Attending: Family Medicine | Admitting: Registered"

## 2021-04-28 ENCOUNTER — Other Ambulatory Visit: Payer: Self-pay

## 2021-04-28 DIAGNOSIS — K219 Gastro-esophageal reflux disease without esophagitis: Secondary | ICD-10-CM | POA: Diagnosis not present

## 2021-04-28 NOTE — Patient Instructions (Addendum)
Consider getting Turmeric without black pepper, instead try with a little fat Use qualtiy fresh supplements A2 milk may have less of the protein that you may be sensitive to Consider trying snacks in between lunch and dinner. Try starting your meal by eating protein and vegetables before the carbohydrates Breakfast: leave yogurt out of your oatmeal Monitor your symptoms, at least few times   Maybe if you aren't seeing results with these changes look into reading: The Bloated Belly Whisper to investigate your symptoms.

## 2021-04-28 NOTE — Progress Notes (Signed)
Medical Nutrition Therapy:  Appt start time: 1500 end time:  1545.   Assessment:  Primary concerns today: GERD   Patient states current problem has been a chronic condition but has become worse. Pt states she has IBS symptoms including diarrhea and stomach pain. Pt reports other symptoms of bloating and gassy after mostly after lunch. Pt report having the same breakfast most mornings.  Pt states staying in one position creates pain. Pt reports gastritis from endoscopy.  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 Reflux doesn't disturb sleep. Sleeps on stomach, toss and turn a lot.  MEDICATIONS: reviewed SUPPLEMENTS: tumeric, Omega-3, probiotic, cholest off (x2 mo)   DIETARY INTAKE: Avoiding caffeine (coffee) since 2017  24-hr recall:  B (7:30-8 AM): oatmeal, cinnamon, yogurt, honey, nuts, berries  Snk ( AM): none  L ( PM): banana, caco, English muffin with peanut butter Snk ( PM): sometimes granola OR chips (no grain) and hummus OR yogurt, carrots, apples D ( PM): {Hello fresh 5 yrs meal service ) 1/2 sauce, less fat. Snk ( PM): chips Beverages: chamomile tea, 64 oz water  Usual physical activity: 3x week aerobic & resistance, walking  Estimated energy needs: 2100 calories (appears to have higher metabolism)  Progress Towards Goal(s):  In progress.   Nutritional Diagnosis:  NB-1.1 Food and nutrition-related knowledge deficit As related to FODMAPs.  As evidenced by states new information gained regarding potential foods that could be causing IBS symptoms.    Intervention:  Nutrition Education Topics: IBS and role of FODMAPs Lactose intolerance vs milk protein intolerance Strategies to slow down digestion of meals Food that may irritate GERD  Teaching  Method Utilized:  Visual Auditory Hands on  Handouts given during visit include:  none  Barriers to learning/adherence to lifestyle change: none  Demonstrated degree of understanding via:  Teach Back   Monitoring/Evaluation:  Dietary intake, exercise, and body weight June 02, 2021.

## 2021-05-16 ENCOUNTER — Ambulatory Visit: Payer: BC Managed Care – PPO | Admitting: Registered"

## 2021-05-21 ENCOUNTER — Ambulatory Visit: Payer: BC Managed Care – PPO | Admitting: Registered"

## 2021-06-02 ENCOUNTER — Other Ambulatory Visit: Payer: Self-pay

## 2021-06-02 ENCOUNTER — Encounter: Payer: BC Managed Care – PPO | Attending: Family Medicine | Admitting: Registered"

## 2021-06-02 ENCOUNTER — Encounter: Payer: Self-pay | Admitting: Registered"

## 2021-06-02 DIAGNOSIS — K219 Gastro-esophageal reflux disease without esophagitis: Secondary | ICD-10-CM | POA: Diagnosis present

## 2021-06-02 NOTE — Progress Notes (Signed)
Follow-up Medical Nutrition Therapy:  Appt start time: 1515 end time:  1545.   Assessment:  Primary concerns today: GERD   Patient states she switched out all her dairy products to dairy-free which has resolved her stomach issues. Pt reports on the occasion when eating dairy, Lactaid supplement helps prevent issues. Pt states she recently started famotidine and it has mostly resolved acid reflux symptoms.  Pt reports since discovering that dairy was the culpreit to her problems she has expanded her diet and now including red meat, and has pretty much stopped being afraid of eating.  Pt states she has increased physical activity and goal is to workout MWF 1-2 pm if schedule allows.  Pt sates she would like to know how to help reduce acid reflux and nausea during exercisng. Pt reports she does more cardio on eliptical, weights, squats than she was doing before to help build more strenghth to help her chronic lower back pain. Pt states if pushes hard will feel nausea in 20 min; if working out slower will start feeling it at 45 min  Pt states she tried exercising before eating but got sick from not enough food. She recently switched to eating lunch ~11:30-12 and a typical meal would include a vegetable, protein, grain, fruit. Pt states she tried a smoothie from [employer] with spinach, fruit but didn't work.  Preferred Learning Style: No preference indicated   Learning Readiness:  Ready  Sleep: not assessed at follow-up  MEDICATIONS: reviewed, recently started famotidine SUPPLEMENTS: tumeric (prn), Omega-3, probiotic, cholest off (x2 mo)   DIETARY INTAKE: Avoiding caffeine (coffee) since 2017, recently started avoiding banana due to allergy, dairy  24-hr recall: not assessed at follow-up B (7:30-8 AM):  Snk ( AM):  L ( PM):  Snk ( PM):  D ( PM): [name] ( PM):  Beverages:   Usual physical activity: 3x week aerobic & resistance, walking  Estimated energy needs: 2100 calories  (appears to have higher metabolism)  Progress Towards Goal(s):  In progress.   Nutritional Diagnosis:  NB-1.1 Food and nutrition-related knowledge deficit As related to FODMAPs.  As evidenced by states new information gained regarding potential foods that could be causing IBS symptoms.    Intervention:  Nutrition Education Topics: Timing and meal structure for workout days  Plan: Continue avoiding dairy ~2 hours before a workout eat a balanced meal with limited fat ~30 min after workout easily digestible pro/carb snack  Teaching Method Utilized:  Visual Auditory Hands on  Handouts given during visit include: MyPlate for Athletes Meal Plan simple sheet MyPlate detailed foods   Barriers to learning/adherence to lifestyle change: none  Demonstrated degree of understanding via:  Teach Back   Monitoring/Evaluation:  Dietary intake, exercise, and body weight, prn

## 2024-12-01 ENCOUNTER — Encounter: Payer: Self-pay | Admitting: General Surgery

## 2024-12-05 ENCOUNTER — Other Ambulatory Visit: Payer: Self-pay | Admitting: General Surgery

## 2024-12-05 DIAGNOSIS — R222 Localized swelling, mass and lump, trunk: Secondary | ICD-10-CM

## 2024-12-25 ENCOUNTER — Other Ambulatory Visit

## 2024-12-25 DIAGNOSIS — R222 Localized swelling, mass and lump, trunk: Secondary | ICD-10-CM
